# Patient Record
Sex: Female | Born: 1980 | Race: Black or African American | Hispanic: No | Marital: Single | Smoking: Current every day smoker
Health system: Southern US, Community
[De-identification: ages and names within clinical notes are randomized; demographics above are authoritative.]

## PROBLEM LIST (undated history)

## (undated) DIAGNOSIS — D649 Anemia, unspecified: Secondary | ICD-10-CM

## (undated) DIAGNOSIS — L089 Local infection of the skin and subcutaneous tissue, unspecified: Secondary | ICD-10-CM

## (undated) HISTORY — PX: DILATION AND CURETTAGE OF UTERUS: SHX78

---
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
## 1999-04-15 ENCOUNTER — Other Ambulatory Visit: Admission: RE | Admit: 1999-04-15 | Discharge: 1999-04-15 | Payer: Self-pay | Admitting: Obstetrics

## 1999-04-30 ENCOUNTER — Encounter (INDEPENDENT_AMBULATORY_CARE_PROVIDER_SITE_OTHER): Payer: Self-pay | Admitting: Specialist

## 1999-04-30 ENCOUNTER — Ambulatory Visit (HOSPITAL_COMMUNITY): Admission: RE | Admit: 1999-04-30 | Discharge: 1999-04-30 | Payer: Self-pay | Admitting: Obstetrics

## 1999-09-19 ENCOUNTER — Encounter: Payer: Self-pay | Admitting: Obstetrics

## 1999-09-19 ENCOUNTER — Inpatient Hospital Stay (HOSPITAL_COMMUNITY): Admission: AD | Admit: 1999-09-19 | Discharge: 1999-09-19 | Payer: Self-pay | Admitting: *Deleted

## 2000-01-28 ENCOUNTER — Encounter: Payer: Self-pay | Admitting: Obstetrics

## 2000-01-28 ENCOUNTER — Inpatient Hospital Stay (HOSPITAL_COMMUNITY): Admission: AD | Admit: 2000-01-28 | Discharge: 2000-01-28 | Payer: Self-pay | Admitting: Obstetrics

## 2002-05-25 ENCOUNTER — Ambulatory Visit (HOSPITAL_COMMUNITY): Admission: RE | Admit: 2002-05-25 | Discharge: 2002-05-25 | Payer: Self-pay | Admitting: *Deleted

## 2002-06-01 ENCOUNTER — Encounter: Admission: RE | Admit: 2002-06-01 | Discharge: 2002-06-01 | Payer: Self-pay | Admitting: *Deleted

## 2002-06-15 ENCOUNTER — Encounter: Admission: RE | Admit: 2002-06-15 | Discharge: 2002-06-15 | Payer: Self-pay | Admitting: *Deleted

## 2002-06-29 ENCOUNTER — Encounter: Admission: RE | Admit: 2002-06-29 | Discharge: 2002-06-29 | Payer: Self-pay | Admitting: *Deleted

## 2002-07-13 ENCOUNTER — Ambulatory Visit (HOSPITAL_COMMUNITY): Admission: RE | Admit: 2002-07-13 | Discharge: 2002-07-13 | Payer: Self-pay | Admitting: *Deleted

## 2002-07-13 ENCOUNTER — Encounter: Admission: RE | Admit: 2002-07-13 | Discharge: 2002-07-13 | Payer: Self-pay | Admitting: *Deleted

## 2002-07-27 ENCOUNTER — Encounter: Admission: RE | Admit: 2002-07-27 | Discharge: 2002-07-27 | Payer: Self-pay | Admitting: *Deleted

## 2002-08-10 ENCOUNTER — Encounter: Admission: RE | Admit: 2002-08-10 | Discharge: 2002-08-10 | Payer: Self-pay | Admitting: *Deleted

## 2002-08-10 ENCOUNTER — Inpatient Hospital Stay (HOSPITAL_COMMUNITY): Admission: AD | Admit: 2002-08-10 | Discharge: 2002-08-12 | Payer: Self-pay | Admitting: *Deleted

## 2002-08-17 ENCOUNTER — Encounter: Admission: RE | Admit: 2002-08-17 | Discharge: 2002-08-17 | Payer: Self-pay | Admitting: *Deleted

## 2002-08-22 ENCOUNTER — Ambulatory Visit (HOSPITAL_COMMUNITY): Admission: RE | Admit: 2002-08-22 | Discharge: 2002-08-22 | Payer: Self-pay | Admitting: *Deleted

## 2002-08-31 ENCOUNTER — Encounter: Admission: RE | Admit: 2002-08-31 | Discharge: 2002-08-31 | Payer: Self-pay | Admitting: *Deleted

## 2002-09-14 ENCOUNTER — Encounter: Admission: RE | Admit: 2002-09-14 | Discharge: 2002-09-14 | Payer: Self-pay | Admitting: *Deleted

## 2002-09-28 ENCOUNTER — Encounter: Admission: RE | Admit: 2002-09-28 | Discharge: 2002-09-28 | Payer: Self-pay | Admitting: *Deleted

## 2002-09-28 ENCOUNTER — Ambulatory Visit (HOSPITAL_COMMUNITY): Admission: RE | Admit: 2002-09-28 | Discharge: 2002-09-28 | Payer: Self-pay | Admitting: *Deleted

## 2002-10-12 ENCOUNTER — Encounter: Admission: RE | Admit: 2002-10-12 | Discharge: 2002-10-12 | Payer: Self-pay | Admitting: *Deleted

## 2002-10-19 ENCOUNTER — Encounter: Admission: RE | Admit: 2002-10-19 | Discharge: 2002-10-19 | Payer: Self-pay | Admitting: *Deleted

## 2002-11-02 ENCOUNTER — Encounter: Admission: RE | Admit: 2002-11-02 | Discharge: 2002-11-02 | Payer: Self-pay | Admitting: *Deleted

## 2002-11-09 ENCOUNTER — Encounter: Admission: RE | Admit: 2002-11-09 | Discharge: 2002-11-09 | Payer: Self-pay | Admitting: *Deleted

## 2002-11-16 ENCOUNTER — Encounter: Admission: RE | Admit: 2002-11-16 | Discharge: 2002-11-16 | Payer: Self-pay | Admitting: *Deleted

## 2002-11-23 ENCOUNTER — Encounter: Admission: RE | Admit: 2002-11-23 | Discharge: 2002-11-23 | Payer: Self-pay | Admitting: *Deleted

## 2002-11-26 ENCOUNTER — Inpatient Hospital Stay (HOSPITAL_COMMUNITY): Admission: AD | Admit: 2002-11-26 | Discharge: 2002-11-26 | Payer: Self-pay | Admitting: Obstetrics and Gynecology

## 2002-11-30 ENCOUNTER — Encounter: Admission: RE | Admit: 2002-11-30 | Discharge: 2002-11-30 | Payer: Self-pay | Admitting: *Deleted

## 2002-12-06 ENCOUNTER — Inpatient Hospital Stay (HOSPITAL_COMMUNITY): Admission: AD | Admit: 2002-12-06 | Discharge: 2002-12-06 | Payer: Self-pay | Admitting: *Deleted

## 2002-12-08 ENCOUNTER — Inpatient Hospital Stay (HOSPITAL_COMMUNITY): Admission: AD | Admit: 2002-12-08 | Discharge: 2002-12-11 | Payer: Self-pay | Admitting: *Deleted

## 2002-12-08 ENCOUNTER — Encounter: Admission: RE | Admit: 2002-12-08 | Discharge: 2002-12-08 | Payer: Self-pay | Admitting: *Deleted

## 2004-01-29 ENCOUNTER — Emergency Department (HOSPITAL_COMMUNITY): Admission: EM | Admit: 2004-01-29 | Discharge: 2004-01-29 | Payer: Self-pay | Admitting: Emergency Medicine

## 2004-07-11 ENCOUNTER — Inpatient Hospital Stay (HOSPITAL_COMMUNITY): Admission: AD | Admit: 2004-07-11 | Discharge: 2004-07-11 | Payer: Self-pay | Admitting: Obstetrics

## 2004-07-18 ENCOUNTER — Encounter (HOSPITAL_COMMUNITY): Admission: AD | Admit: 2004-07-18 | Discharge: 2004-08-17 | Payer: Self-pay | Admitting: Obstetrics

## 2004-08-22 ENCOUNTER — Encounter (HOSPITAL_COMMUNITY): Admission: AD | Admit: 2004-08-22 | Discharge: 2004-09-21 | Payer: Self-pay | Admitting: Obstetrics

## 2004-12-06 ENCOUNTER — Inpatient Hospital Stay (HOSPITAL_COMMUNITY): Admission: AD | Admit: 2004-12-06 | Discharge: 2004-12-06 | Payer: Self-pay | Admitting: Obstetrics

## 2004-12-12 ENCOUNTER — Inpatient Hospital Stay (HOSPITAL_COMMUNITY): Admission: AD | Admit: 2004-12-12 | Discharge: 2004-12-14 | Payer: Self-pay | Admitting: Obstetrics

## 2006-02-02 ENCOUNTER — Emergency Department (HOSPITAL_COMMUNITY): Admission: EM | Admit: 2006-02-02 | Discharge: 2006-02-02 | Payer: Self-pay | Admitting: Family Medicine

## 2006-04-27 ENCOUNTER — Emergency Department (HOSPITAL_COMMUNITY): Admission: EM | Admit: 2006-04-27 | Discharge: 2006-04-27 | Payer: Self-pay | Admitting: Family Medicine

## 2006-09-30 ENCOUNTER — Inpatient Hospital Stay (HOSPITAL_COMMUNITY): Admission: AD | Admit: 2006-09-30 | Discharge: 2006-10-06 | Payer: Self-pay | Admitting: Obstetrics

## 2006-10-11 ENCOUNTER — Emergency Department (HOSPITAL_COMMUNITY): Admission: EM | Admit: 2006-10-11 | Discharge: 2006-10-11 | Payer: Self-pay | Admitting: Family Medicine

## 2006-11-27 ENCOUNTER — Inpatient Hospital Stay (HOSPITAL_COMMUNITY): Admission: AD | Admit: 2006-11-27 | Discharge: 2006-11-30 | Payer: Self-pay | Admitting: Obstetrics

## 2006-11-28 ENCOUNTER — Encounter (INDEPENDENT_AMBULATORY_CARE_PROVIDER_SITE_OTHER): Payer: Self-pay | Admitting: Obstetrics

## 2010-08-02 NOTE — Discharge Summary (Signed)
NAME:  Christine Ingram, Christine Ingram                       ACCOUNT NO.:  [id number]   MEDICAL RECORD NO.:  [id number]                   PATIENT TYPE:  INP   LOCATION:  9154                                 FACILITY:  [facility]   PHYSICIAN:  Conni Elliot, M.D.             DATE OF BIRTH:  Jan 02, 1981   DATE OF ADMISSION:  08/10/2002  DATE OF DISCHARGE:  08/12/2002                                 DISCHARGE SUMMARY   DISCHARGE DIAGNOSES:  37. A 30 year old G7 P1-1-4-1 at 77 and five-sevenths weeks with threatened     preterm labor.  2. History of preterm delivery at 24 weeks.  3. History of multiple early pregnancy losses.  4. Positive for group B strep.  5. Fetal fibronectin from Aug 11, 2002 negative.  6. Positive ANA with a titer of 1:40.   DISCHARGE MEDICATIONS:  1. Amoxicillin 500 mg one p.o. t.i.d.  2. Prenatal vitamins.  3. Baby aspirin daily.   DISCHARGE INSTRUCTIONS:  1. The patient was given routine preterm labor instructions.  2. Advised to maintain bedrest and to avoid sexual intercourse or anything     that would cause her to have an orgasm.  3. The patient is to follow up at the high risk clinic on Wednesday, August 17, 2002.   HISTORY OF PRESENT ILLNESS:  The patient is a 30 year old G7 P1-1-4-1 (3  SABs, 1 TAB) who presented at 94 and three-sevenths weeks with complaint of  contractions.  Her dating is based on an unsure LMP that is consistent with  a 12-week ultrasound, giving her an estimated due date of December 05, 2002.  The patient had been complaining of Braxton Hicks uterine  contractions for approximately one week.  These contractions had increased  after her high risk clinic visit on the day of admission.  At the time of  that clinic her cervix was noted to be posterior, long, and fingertip  external, closed internal.  She was given a prescription for penicillin.  At  that time she also was noted to have a negative urinalysis.   OBSTETRICAL HISTORY:  Significant  for three early SABs, one TAB, history of  a 24-week spontaneous vaginal delivery in which the 1 pound 5 ounce infant  did not survive.  The patient had a 37 week successful spontaneous vaginal  delivery of a 5 pound 12 ounce infant in 1997.  She did have preterm labor  and was GBS positive.  Since then she has had no successful pregnancies.   PRIOR MEDICAL HISTORY:  Noncontributory.  The patient was noticed to have a  positive ANA titer of 1:40.   PHYSICAL EXAMINATION:  VITAL SIGNS:  Admission vital signs were within  normal limits including a blood pressure of 112/62.  GENERAL:  Noncontributory.  PELVIC:  Speculum exam revealed grayish discharge and no lesions.  Digital  exam performed by Caren Griffins, C.N.M. revealed external 1,  internal closed,  soft, long, midline, without any lower segment development, high.  MONITOR:  Dopplers had a heart rate of 150s to 160s and tocometry revealed  contractions approximately three to five minutes frequency lasting 30-40  seconds and mild intensity.   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  She was initiated on IV fluids,  ibuprofen, and Unasyn for antibiotics.  On hospital day #2 a fetal  fibronectin was obtained and was negative.  GC and chlamydia are both  negative.  GBS is positive.   The patient was discharged on Aug 12, 2002.  She had not had any  contractions for greater than 24 hours being off ibuprofen.  Her cervix was  unchanged.  Repeat exam by Dr. Gavin Potters being mildly firm, fingertip external  os, closed, long, no development of the lower segment.   DISPOSITION:  She was discharged with a prescription for amoxicillin and to  follow up at the high risk clinic the following week.     Douglass Rivers, M.D.                      Conni Elliot, M.D.    CH/MEDQ  D:  08/12/2002  T:   08/13/2002  Job:  [id number]

## 2010-08-02 NOTE — Discharge Summary (Signed)
NAMEBRIE, EPPARD NO.:  [id number]   MEDICAL RECORD NO.:  [id number]          PATIENT TYPE:  INP   LOCATION:  9157                          FACILITY:  [facility]   PHYSICIAN:  Kathreen Cosier, M.D.DATE OF BIRTH:  Mar 30, 1980   DATE OF ADMISSION:  09/30/2006  DATE OF DISCHARGE:  10/06/2006                               DISCHARGE SUMMARY   HISTORY OF PRESENT ILLNESS:  The patient is a 30-year-old gravida 8-3-0-  3, EDC 10/108.  She had 1 prenatal visit. GBS unknown. On exam in my  office she had ruptured membranes. She was having occasional  contraction.   HOSPITAL COURSE:  She was admitted to the hospital. She received  betamethasone 12.5 IM and then 24 hours. She was also started on  Ampicillin and gentamicin and magnesium sulfate 4 gram load and 3 gram  per hour. Ultrasound 7/17 said EDC 10/15, 29-week breech. She continued  using 2-3 pads a day, but then she stopped leaking by 7/21. Repeat AFI  was normal. There was no sign of leakage and she was not feeling her  contractions so she was discharged home on Procardia 10 mg every 6  hours.   DISCHARGE DIAGNOSIS:  Status post high leak which subsequently stopped.           ______________________________  Kathreen Cosier, M.D.     BAM/MEDQ  D:  10/28/2006  T:  10/29/2006  Job:  [id number]

## 2010-08-02 NOTE — Op Note (Signed)
[facility]  Patient:    Christine Ingram, Christine Ingram                    MRN: [id number] Proc. Date: 04/30/99 Adm. Date:  [id number] Attending:  Venita Sheffield                           Operative Report  PREOPERATIVE DIAGNOSIS:       Intrauterine fetal demise at nine-weeks.  POSTOPERATIVE DIAGNOSIS:      Intrauterine fetal demise at nine-weeks.  PROCEDURE:                    Dilatation and evacuation.  SURGEON:                      Kathreen Cosier, M.D.  ANESTHESIA:                   MAC.  DESCRIPTION OF PROCEDURE:     Using MAC, the patient in the lithotomy position,  perineum and vagina prepped and draped.  Bladder emptied with a straight catheter. Vaginal examination revealed uterus to be 10 or 12-week size.  A weighted speculum attached to the vagina.  The cervix was injected at 3, 9, and 12 oclock with 1% Xylocaine total of 5 cc.  The cervix was grasped at 12 oclock with a tenaculum. The endometrial cavity is found to be 11 cm.  The cervix dilated to #31 Shawnie Pons, using a #10 suction, the uterine contents were aspirated.  A large amount of tissue was obtained.  This was done until the cavity was clean.  The patient tolerated the procedure and taken to the recovery room in good condition. DD:  04/30/99 TD:  04/30/99 Job: [id number]

## 2010-11-09 ENCOUNTER — Emergency Department (HOSPITAL_COMMUNITY)
Admission: EM | Admit: 2010-11-09 | Discharge: 2010-11-09 | Payer: Medicaid Other | Attending: Emergency Medicine | Admitting: Emergency Medicine

## 2010-11-09 DIAGNOSIS — S61409A Unspecified open wound of unspecified hand, initial encounter: Secondary | ICD-10-CM | POA: Insufficient documentation

## 2010-11-09 DIAGNOSIS — Y92009 Unspecified place in unspecified non-institutional (private) residence as the place of occurrence of the external cause: Secondary | ICD-10-CM | POA: Insufficient documentation

## 2010-11-09 DIAGNOSIS — F1010 Alcohol abuse, uncomplicated: Secondary | ICD-10-CM | POA: Insufficient documentation

## 2010-11-15 ENCOUNTER — Emergency Department (HOSPITAL_COMMUNITY)
Admission: EM | Admit: 2010-11-15 | Discharge: 2010-11-15 | Disposition: A | Discharge status: Home / Self Care | Payer: Medicaid Other | Attending: Emergency Medicine | Admitting: Emergency Medicine

## 2010-11-15 DIAGNOSIS — L089 Local infection of the skin and subcutaneous tissue, unspecified: Secondary | ICD-10-CM | POA: Insufficient documentation

## 2010-11-15 DIAGNOSIS — R609 Edema, unspecified: Secondary | ICD-10-CM | POA: Insufficient documentation

## 2010-11-15 DIAGNOSIS — M79609 Pain in unspecified limb: Secondary | ICD-10-CM | POA: Insufficient documentation

## 2010-12-27 LAB — CBC
MCHC: 34.2
MCHC: 34.3
Platelets: 126 — ABNORMAL LOW
Platelets: 157
RBC: 3.1 — ABNORMAL LOW
RDW: 13
WBC: 7.1

## 2010-12-30 LAB — CBC
HCT: 32.5 — ABNORMAL LOW
MCV: 89
Platelets: 160
RBC: 3.65 — ABNORMAL LOW
WBC: 9

## 2010-12-30 LAB — CREATININE, SERUM
Creatinine, Ser: 0.66
GFR calc Af Amer: >60
GFR calc non Af Amer: >60

## 2010-12-30 LAB — RPR: RPR Ser Ql: NONREACTIVE

## 2012-05-08 ENCOUNTER — Emergency Department (INDEPENDENT_AMBULATORY_CARE_PROVIDER_SITE_OTHER)
Admission: EM | Admit: 2012-05-08 | Discharge: 2012-05-08 | Disposition: A | Discharge status: Home / Self Care | Payer: Self-pay | Source: Home / Self Care | Attending: Emergency Medicine | Admitting: Emergency Medicine

## 2012-05-08 ENCOUNTER — Encounter (HOSPITAL_COMMUNITY): Payer: Self-pay | Admitting: Emergency Medicine

## 2012-05-08 ENCOUNTER — Other Ambulatory Visit (HOSPITAL_COMMUNITY)
Admission: RE | Admit: 2012-05-08 | Discharge: 2012-05-08 | Disposition: A | Discharge status: Home / Self Care | Payer: Self-pay | Source: Ambulatory Visit | Attending: Emergency Medicine | Admitting: Emergency Medicine

## 2012-05-08 DIAGNOSIS — R109 Unspecified abdominal pain: Secondary | ICD-10-CM

## 2012-05-08 DIAGNOSIS — N760 Acute vaginitis: Secondary | ICD-10-CM | POA: Insufficient documentation

## 2012-05-08 DIAGNOSIS — Z113 Encounter for screening for infections with a predominantly sexual mode of transmission: Secondary | ICD-10-CM | POA: Insufficient documentation

## 2012-05-08 LAB — POCT URINALYSIS DIP (DEVICE)
Glucose, UA: NEGATIVE mg/dL
Nitrite: NEGATIVE
Protein, ur: 30 mg/dL — AB
Specific Gravity, Urine: 1.015 (ref 1.005–1.030)
Urobilinogen, UA: 4 mg/dL — ABNORMAL HIGH (ref 0.0–1.0)

## 2012-05-08 NOTE — ED Notes (Signed)
Pt c/o bright red vaginal spotting that started Friday night. Pt states the she had a positive home pregnancy test 6 days prior.  Pt denies pelvic or abdominal pain.

## 2012-05-08 NOTE — ED Provider Notes (Addendum)
Chief Complaint  Patient presents with  . Possible Pregnancy    vaginal bright red spotting that started friday night.     History of Present Illness:   Christine Ingram is a 32 year old female who's last menstrual period was January 17. She had some light spotting yesterday and today. She also had some abdominal pain lasting for a few seconds at a time radiating up towards the navel. She took 2 home pregnancy tests. One was negative and 1 was positive. She also had some nausea and vomiting. She denies any other pregnancy symptoms. The patient has been having unprotected intercourse. She has 5 children at home and has had a miscarriage and another baby that died as a premature infant. She denies any fever, chills, vaginal discharge, itching, or urinary symptoms.  Review of Systems:  Other than noted above, the patient denies any of the following symptoms: Systemic:  No fever, chills, sweats, fatigue, or weight loss. GI:  No abdominal pain, nausea, anorexia, vomiting, diarrhea, constipation, melena or hematochezia. GU:  No dysuria, frequency, urgency, hematuria, vaginal discharge, itching, or abnormal vaginal bleeding. Skin:  No rash or itching.   PMFSH:  Past medical history, family history, social history, meds, and allergies were reviewed.  Physical Exam:   Vital signs:  LMP 04/02/2012 General:  Alert, oriented and in no distress. Lungs:  Breath sounds clear and equal bilaterally.  No wheezes, rales or rhonchi. Heart:  Regular rhythm.  No gallops or murmers. Abdomen:  Soft, flat and non-distended.  No organomegaly or mass.  No tenderness, guarding or rebound.  Bowel sounds normally active. Pelvic exam:  Normal external genitalia, vaginal and cervical mucosa were normal, no discharge or bleeding, no pain on cervical motion. Uterus was normal in size and shape and nontender. No adnexal tenderness or mass. Skin:  Clear, warm and dry.  Labs:   Results for orders placed during the hospital  encounter of 05/08/12  POCT URINALYSIS DIP (DEVICE)      Result Value Range   Glucose, UA NEGATIVE  NEGATIVE mg/dL   Bilirubin Urine NEGATIVE  NEGATIVE   Ketones, ur NEGATIVE  NEGATIVE mg/dL   Specific Gravity, Urine 1.015  1.005 - 1.030   Hgb urine dipstick LARGE (*) NEGATIVE   pH 8.0  5.0 - 8.0   Protein, ur 30 (*) NEGATIVE mg/dL   Urobilinogen, UA 4.0 (*) 0.0 - 1.0 mg/dL   Nitrite NEGATIVE  NEGATIVE   Leukocytes, UA NEGATIVE  NEGATIVE  POCT PREGNANCY, URINE      Result Value Range   Preg Test, Ur NEGATIVE  NEGATIVE     Assessment:  The encounter diagnosis was Normal menstrual period.  She does not appear to be pregnant right now and this does not appear to represent a miscarriage or a tubal ectopic pregnancy.  Plan:   1.  The following meds were prescribed:  There are no discharge medications for this patient.  2.  The patient was instructed in symptomatic care and handouts were given. Also discussed birth control options with the patient and she was given a few phone numbers to call for getting on birth control. 3.  The patient was told to return if becoming worse in any way, if no better in 3 or 4 days, and given some red flag symptoms such as heavy bleeding, increase in pain, fever, nausea, or vomiting that would indicate earlier return.    Reuben Likes, MD 05/08/12 1954  Reuben Likes, MD 05/08/12 [phone]

## 2012-05-11 ENCOUNTER — Telehealth (HOSPITAL_COMMUNITY): Payer: Self-pay | Admitting: Emergency Medicine

## 2012-05-11 MED ORDER — METRONIDAZOLE 500 MG PO TABS: 500.0000 mg | ORAL_TABLET | Freq: Two times a day (BID) | ORAL | Status: DC

## 2012-05-11 MED ORDER — AZITHROMYCIN 500 MG PO TABS: 1000.0000 mg | ORAL_TABLET | Freq: Every day | ORAL | Status: DC

## 2012-05-11 NOTE — ED Notes (Signed)
DNA probes came back positive for gonorrhea, Chlamydia, and Gardnerella. She'll need to come in for Rocephin 250 mg IM, and I will send in prescriptions to her pharmacy for Flagyl 500 mg twice a day for a week and azithromycin 1000 mg as a single oral dose.  Reuben Likes, MD 05/11/12 2116

## 2012-05-11 NOTE — Telephone Encounter (Signed)
Message copied by Reuben Likes on Tue May 11, 2012  8:54 PM ------      Message from: Vassie Moselle      Created: Tue May 11, 2012  6:08 PM      Regarding: labs                   ----- Message -----         From: Lab In [facility] Interface         Sent: 05/10/2012   3:26 PM           To: [facility] Follow Up             ------

## 2012-05-11 NOTE — Telephone Encounter (Signed)
Message copied by Reuben Likes on Tue May 11, 2012  9:15 PM ------      Message from: Vassie Moselle      Created: Tue May 11, 2012  5:58 PM      Regarding: labs       GC/Chlamydia and Gardnerella pos. Needs tx.      Vassie Moselle      05/11/2012            ----- Message -----         From: [facility] Interface         Sent: 05/10/2012   3:26 PM           To: Vassie Moselle, RN                   ------

## 2012-05-11 NOTE — ED Notes (Signed)
DNA probes were positive for Chlamydia and Gardnerella. She will need Flagyl 500 mg twice a day for one week and azithromycin 1000 mg by mouth one time.  Reuben Likes, MD 05/11/12 [phone]

## 2012-05-12 ENCOUNTER — Encounter (HOSPITAL_COMMUNITY): Payer: Self-pay

## 2012-05-12 ENCOUNTER — Inpatient Hospital Stay (HOSPITAL_COMMUNITY)
Admission: AD | Admit: 2012-05-12 | Discharge: 2012-05-12 | Disposition: A | Discharge status: Home / Self Care | Payer: Self-pay | Source: Ambulatory Visit | Attending: Obstetrics & Gynecology | Admitting: Obstetrics & Gynecology

## 2012-05-12 DIAGNOSIS — R109 Unspecified abdominal pain: Secondary | ICD-10-CM | POA: Insufficient documentation

## 2012-05-12 DIAGNOSIS — A5619 Other chlamydial genitourinary infection: Secondary | ICD-10-CM | POA: Insufficient documentation

## 2012-05-12 DIAGNOSIS — N739 Female pelvic inflammatory disease, unspecified: Secondary | ICD-10-CM | POA: Insufficient documentation

## 2012-05-12 DIAGNOSIS — A5400 Gonococcal infection of lower genitourinary tract, unspecified: Secondary | ICD-10-CM | POA: Insufficient documentation

## 2012-05-12 HISTORY — DX: Anemia, unspecified: D64.9

## 2012-05-12 LAB — URINALYSIS, ROUTINE W REFLEX MICROSCOPIC
Bilirubin Urine: NEGATIVE
Glucose, UA: NEGATIVE mg/dL
Hgb urine dipstick: NEGATIVE
Ketones, ur: NEGATIVE mg/dL
Leukocytes, UA: NEGATIVE
Protein, ur: NEGATIVE mg/dL
pH: 8 (ref 5.0–8.0)

## 2012-05-12 MED ORDER — OXYCODONE-ACETAMINOPHEN 5-325 MG PO TABS: 1.0000 | ORAL_TABLET | ORAL | Status: DC | PRN

## 2012-05-12 MED ORDER — OXYCODONE-ACETAMINOPHEN 5-325 MG PO TABS
2.0000 | ORAL_TABLET | Freq: Once | ORAL | Status: AC
  Administered 2012-05-12: 2 via ORAL
  Filled 2012-05-12: qty 2

## 2012-05-12 MED ORDER — METRONIDAZOLE 500 MG PO TABS: 500.0000 mg | ORAL_TABLET | Freq: Two times a day (BID) | ORAL | Status: DC

## 2012-05-12 MED ORDER — DOXYCYCLINE HYCLATE 50 MG PO CAPS: 100.0000 mg | ORAL_CAPSULE | Freq: Two times a day (BID) | ORAL | Status: DC

## 2012-05-12 MED ORDER — CEFTRIAXONE SODIUM 250 MG IJ SOLR
250.0000 mg | Freq: Once | INTRAMUSCULAR | Status: AC
  Administered 2012-05-12: 250 mg via INTRAMUSCULAR
  Filled 2012-05-12: qty 250

## 2012-05-12 NOTE — MAU Provider Note (Signed)
  History     CSN: [id number]  Arrival date and time: 05/12/12 1756   First Provider Initiated Contact with Patient 05/12/12 1919      Chief Complaint  Patient presents with  . Abdominal Pain   HPI This is a 32 y.o. female who presents with C/O intermittent shooting pains in lower pelvis. Denies discharge or bleeding. Denies fever or back pain.   Was seen in Urgent Care and found to have Gonorrhea, Chlamydia, and BV.  Was prescribed medicines but did not get them filled. Doesn't know why.  RN Note: Pt states went to [facility] on 05/08/2012, had ?positive upt at home, went to [facility] and had negative upt there. Last pm began having shooting pains in lower pelvic area. LMP-05/06/2012, denies abnormal vaginal discharge. Pain worsens with movement.       OB History   Grav Para Term Preterm Abortions TAB SAB Ect Mult Living   8 6 5 1 2 1 1   5       Past Medical History  Diagnosis Date  . Preterm labor   . Anemia     Past Surgical History  Procedure Laterality Date  . Dilation and curettage of uterus      History reviewed. No pertinent family history.  History  Substance Use Topics  . Smoking status: Current Every Day Smoker -- 0.25 packs/day    Types: Cigarettes  . Smokeless tobacco: Never Used  . Alcohol Use: No    Allergies: No Known Allergies  Prescriptions prior to admission  Medication Sig Dispense Refill  . acetaminophen (TYLENOL) 500 MG tablet Take 1,000 mg by mouth as needed for pain (for pain).        Review of Systems  Constitutional: Negative for fever, chills and malaise/fatigue.  Gastrointestinal: Positive for abdominal pain. Negative for nausea, vomiting, diarrhea and constipation.  Genitourinary: Negative for dysuria.  Musculoskeletal: Negative for myalgias.  Neurological: Negative for dizziness and headaches.   Physical Exam   Blood pressure 146/90, pulse 109, temperature 99 F (37.2 C), temperature source Oral, resp. rate 20, height 5\' 2"  (1.575 m),  weight 134 lb 2 oz (60.839 kg), last menstrual period 05/06/2012, unknown if currently breastfeeding.  Physical Exam  Constitutional: She is oriented to person, place, and time. She appears well-developed and well-nourished. No distress.  HENT:  Head: Normocephalic.  Cardiovascular: Normal rate.   Respiratory: Effort normal.  GI: Soft. She exhibits no distension and no mass. There is tenderness. There is no rebound and no guarding.  Genitourinary: Uterus normal. Vaginal discharge (white discharge) found.  No cervical motion tenderness Generalized tenderness throughout bilaterally No rebound  Musculoskeletal: Normal range of motion.  Neurological: She is alert and oriented to person, place, and time.  Skin: Skin is warm and dry.  Psychiatric: She has a normal mood and affect.     MAU Course  Procedures  MDM UA negative  GC/Ch/Garderella positive from Urgent Care  Assessment and Plan  A:  PID  P:  Will treat with Rocephin here       Rx Doxy x14 days and Flagyl x 7 d      Advised to go to [facility] for serum testing for HIV, Hepatitis, and syphilis      Patient agrees to take meds and to go get testing.   [facility] 05/12/2012, 7:35 PM

## 2012-05-12 NOTE — MAU Note (Signed)
Pt states went to [facility] on 05/08/2012, had ?positive upt at home, went to [facility] and had negative upt there. Last pm began having shooting pains in lower pelvic area. LMP-05/06/2012, denies abnormal vaginal discharge. Pain worsens with movement.

## 2012-05-13 ENCOUNTER — Telehealth (HOSPITAL_COMMUNITY): Payer: Self-pay | Admitting: *Deleted

## 2012-05-13 ENCOUNTER — Encounter: Payer: Self-pay | Admitting: Family

## 2012-05-13 NOTE — ED Notes (Signed)
I called pt.  Pt. verified x 2 and given results.  Pt. said she started having bad pain yesterday and went to [facility]. She said got treated there.  I verified that she got on her chart that she got Rocephin 250 mg. IM and Rx. of Doxycycline and Flagyl e-prescribed to her pharmacy.  Pt. said she is going to pick up the medicine now.  Pt. instructed to no alcohol while taking Flagyl.  Pt. instructed to notify her partner, no sex for 1 week and to practice safe sex. Pt. told to get HIV retested in 6 mos.  She can get HIV testing at the [facility]. STD clinic, by appointment. DHHS form completed and faxed to the [facility] Department.  Dr. Melanee Spry orders from 2/25 discontinued. I will notify him of change in plan and treatment. Vassie Moselle 05/13/2012

## 2012-06-09 ENCOUNTER — Encounter (HOSPITAL_COMMUNITY): Payer: Self-pay

## 2012-06-09 ENCOUNTER — Emergency Department (HOSPITAL_COMMUNITY)
Admission: EM | Admit: 2012-06-09 | Discharge: 2012-06-09 | Disposition: A | Discharge status: Home / Self Care | Payer: Self-pay | Source: Home / Self Care

## 2012-06-09 DIAGNOSIS — L309 Dermatitis, unspecified: Secondary | ICD-10-CM

## 2012-06-09 DIAGNOSIS — L259 Unspecified contact dermatitis, unspecified cause: Secondary | ICD-10-CM

## 2012-06-09 MED ORDER — TRIAMCINOLONE ACETONIDE 40 MG/ML IJ SUSP
40.0000 mg | Freq: Once | INTRAMUSCULAR | Status: AC
  Administered 2012-06-09: 40 mg via INTRAMUSCULAR

## 2012-06-09 MED ORDER — TRIAMCINOLONE ACETONIDE 40 MG/ML IJ SUSP
INTRAMUSCULAR | Status: AC
  Filled 2012-06-09: qty 5

## 2012-06-09 MED ORDER — TRIAMCINOLONE ACETONIDE 0.1 % EX CREA: TOPICAL_CREAM | Freq: Two times a day (BID) | CUTANEOUS | Status: DC

## 2012-06-09 MED ORDER — METHYLPREDNISOLONE 4 MG PO KIT: PACK | ORAL | Status: DC

## 2012-06-09 NOTE — ED Notes (Signed)
Noted a rash on both lower legs, abd , back ; on doxycycline for STD  (500 BID ) ; no new foods, soaps, lotions

## 2012-06-09 NOTE — ED Provider Notes (Signed)
Medical screening examination/treatment/procedure(s) were performed by resident physician or non-physician practitioner and as supervising physician I was immediately available for consultation/collaboration.   Eulah Walkup DOUGLAS MD.   Sayuri Rhames D Ranson Belluomini, MD 06/09/12 2052 

## 2012-06-09 NOTE — ED Provider Notes (Signed)
History     CSN: [id number]  Arrival date & time 06/09/12  1632   First MD Initiated Contact with Patient 06/09/12 1706      Chief Complaint  Patient presents with  . Rash    (Consider location/radiation/quality/duration/timing/severity/associated sxs/prior treatment) HPI Comments: 32 year old female presents with a complaint of rash. He began as small red papules lower extremities but over the past 2 days has increased to most body surface areas. She denies 6 symptoms. No fever, chills, malaise, upper respiratory congestion, or other symptoms.  states feels generally well. no one else in the household has a similar rash. 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 Patient states history little itching associated with this rash.   Past Medical History  Diagnosis Date  . Preterm labor   . Anemia     Past Surgical History  Procedure Laterality Date  . Dilation and curettage of uterus      History reviewed. No pertinent family history.  History  Substance Use Topics  . Smoking status: Current Every Day Smoker -- 0.25 packs/day    Types: Cigarettes  . Smokeless tobacco: Never Used  . Alcohol Use: No    OB History   Grav Para Term Preterm Abortions TAB SAB Ect Mult Living   8 6 5 1 2 1 1   5       Review of Systems  Constitutional: Negative.   HENT: Negative.   Respiratory: Negative.   Gastrointestinal: Negative.   Genitourinary: Negative.   Skin:       As per history of present illness    Allergies  Review of patient's allergies indicates no known allergies.  Home Medications   Current Outpatient Rx  Name  Route  Sig  Dispense  Refill  . acetaminophen (TYLENOL) 500 MG tablet   Oral   Take 1,000 mg by mouth as needed for pain (for pain).         Marland Kitchen doxycycline (VIBRAMYCIN) 50 MG capsule   Oral   Take 2 capsules (100 mg total) by mouth 2 (two) times daily.   20 capsule   0   . methylPREDNISolone (MEDROL DOSEPAK) 4 MG tablet     follow package directions   21 tablet   0   . metroNIDAZOLE (FLAGYL) 500 MG tablet   Oral   Take 1 tablet (500 mg total) by mouth 2 (two) times daily.   14 tablet   0   . oxyCODONE-acetaminophen (ROXICET) 5-325 MG per tablet   Oral   Take 1 tablet by mouth every 4 (four) hours as needed for pain.   10 tablet   0   . triamcinolone cream (KENALOG) 0.1 %   Topical   Apply topically 2 (two) times daily. Apply for 2 weeks. May use on face   30 g   0     BP 107/79  Pulse 84  Temp(Src) 98.1 F (36.7 C) (Oral)  Resp 16  SpO2 100%  LMP 05/06/2012  Physical Exam  Nursing note and vitals reviewed. Constitutional: She is oriented to person, place, and time. She appears well-developed and well-nourished. No distress.  Eyes: Conjunctivae and EOM are normal.  Neck: Neck supple.  Cardiovascular: Normal rate and normal heart sounds.   Pulmonary/Chest: Effort normal and breath sounds normal.  Musculoskeletal: Normal range of motion.  Patient states she feels a little tight numbness in the left lower extremity as if there was mild swelling. Both lower extremities appear the same size  to this examiner.  Neurological: She is alert and oriented to person, place, and time.  Skin: Skin is warm and dry. Rash noted.  Macular papular rash with him sleep ability papules over a red base on both lower extremities. There are several satellite papules. There also isolated papules less than populated on the torso and extremities.  Psychiatric: She has a normal mood and affect.    ED Course  Procedures (including critical care time)  Labs Reviewed - No data to display No results found.   1. Dermatitis       MDM  Patient is a dermatitis of uncertain etiology. We will treat with steroids. Kenalog 40 mg IM Medrol Dosepak And triamcinolone cream. If not improving 2-3 days call the dermatologist for an appointment. Return for any symptoms problems or worsening. Followup with a dermatologist  as written on your instructions. If not improving in a couple days or developing new symptoms or worsening call center for appointment or may return.  Hayden Rasmussen, NP 06/09/12 1928

## 2012-06-22 ENCOUNTER — Encounter (HOSPITAL_COMMUNITY): Payer: Self-pay | Admitting: *Deleted

## 2012-06-22 DIAGNOSIS — L738 Other specified follicular disorders: Secondary | ICD-10-CM | POA: Insufficient documentation

## 2012-06-22 DIAGNOSIS — Z862 Personal history of diseases of the blood and blood-forming organs and certain disorders involving the immune mechanism: Secondary | ICD-10-CM | POA: Insufficient documentation

## 2012-06-22 DIAGNOSIS — Z8751 Personal history of pre-term labor: Secondary | ICD-10-CM | POA: Insufficient documentation

## 2012-06-22 DIAGNOSIS — L02419 Cutaneous abscess of limb, unspecified: Secondary | ICD-10-CM | POA: Insufficient documentation

## 2012-06-22 DIAGNOSIS — F17200 Nicotine dependence, unspecified, uncomplicated: Secondary | ICD-10-CM | POA: Insufficient documentation

## 2012-06-22 NOTE — ED Notes (Signed)
The pt  Has had a rash on her lt lower leg for 1-2 weeks.  She [facility] the [facility] but the lesions have become worse.  She has more pain in the lt lower leg and the lesions appear more like ulcers on her leg also the rt leg has some and the rash has spread to other  Parts of her body

## 2012-06-23 ENCOUNTER — Emergency Department (HOSPITAL_COMMUNITY)
Admission: EM | Admit: 2012-06-23 | Discharge: 2012-06-23 | Disposition: A | Discharge status: Home / Self Care | Payer: Self-pay | Attending: Emergency Medicine | Admitting: Emergency Medicine

## 2012-06-23 DIAGNOSIS — L739 Follicular disorder, unspecified: Secondary | ICD-10-CM

## 2012-06-23 DIAGNOSIS — L0291 Cutaneous abscess, unspecified: Secondary | ICD-10-CM

## 2012-06-23 LAB — CBC WITH DIFFERENTIAL/PLATELET
Lymphocytes Relative: 25 % (ref 12–46)
Lymphs Abs: 1.8 10*3/uL (ref 0.7–4.0)
Neutro Abs: 4.7 10*3/uL (ref 1.7–7.7)
Neutrophils Relative %: 64 % (ref 43–77)
Platelets: 205 10*3/uL (ref 150–400)
RBC: 3.83 MIL/uL — ABNORMAL LOW (ref 3.87–5.11)
WBC: 7.3 10*3/uL (ref 4.0–10.5)

## 2012-06-23 LAB — COMPREHENSIVE METABOLIC PANEL
ALT: 15 U/L (ref 0–35)
Alkaline Phosphatase: 49 U/L (ref 39–117)
CO2: 27 mEq/L (ref 19–32)
GFR calc Af Amer: >90 mL/min (ref >90)
GFR calc non Af Amer: >90 mL/min (ref >90)
Glucose, Bld: 100 mg/dL — ABNORMAL HIGH (ref 70–99)
Potassium: 4 mEq/L (ref 3.5–5.1)
Sodium: 139 mEq/L (ref 135–145)

## 2012-06-23 MED ORDER — CLINDAMYCIN HCL 150 MG PO CAPS: 300.0000 mg | ORAL_CAPSULE | Freq: Three times a day (TID) | ORAL | Status: DC

## 2012-06-23 MED ORDER — MORPHINE SULFATE 4 MG/ML IJ SOLN
4.0000 mg | Freq: Once | INTRAMUSCULAR | Status: AC
  Administered 2012-06-23: 4 mg via INTRAVENOUS
  Filled 2012-06-23: qty 1

## 2012-06-23 MED ORDER — OXYCODONE-ACETAMINOPHEN 5-325 MG PO TABS: 1.0000 | ORAL_TABLET | Freq: Four times a day (QID) | ORAL | Status: DC | PRN

## 2012-06-23 MED ORDER — ONDANSETRON HCL 4 MG/2ML IJ SOLN
INTRAMUSCULAR | Status: AC
  Administered 2012-06-23: 07:00:00
  Filled 2012-06-23: qty 2

## 2012-06-23 MED ORDER — HYDROCODONE-ACETAMINOPHEN 5-325 MG PO TABS
1.0000 | ORAL_TABLET | Freq: Once | ORAL | Status: AC
  Administered 2012-06-23: 1 via ORAL
  Filled 2012-06-23: qty 1

## 2012-06-23 MED ORDER — CLINDAMYCIN PHOSPHATE 900 MG/50ML IV SOLN
900.0000 mg | Freq: Once | INTRAVENOUS | Status: AC
  Administered 2012-06-23: 900 mg via INTRAVENOUS
  Filled 2012-06-23: qty 50

## 2012-06-23 NOTE — ED Provider Notes (Signed)
History     CSN: [id number]  Arrival date & time 06/22/12  2227   First MD Initiated Contact with Patient 06/23/12 [phone]      Chief Complaint  Patient presents with  . Rash   HPI  History provided by the patient and recent medical chart. Patient is a 32 year old female with past history of anemia presenting with worsening rash. Patient reports first having a rash to her lower extremities one to 2 weeks ago. She [facility] and diagnosed with an allergic dermatitis. She was given a dose of steroids at the urgent care Center as well as prescriptions for medrol dose pack and topical triamcinolone cream. Patient has been using this the reports having worsening of the rash now with blisters and ulcers. She reports having some drainage from these blisters to the lower legs. There is also some spots and rash to her upper extremities and chest now. Denies any associated fever, chills or sweats. No nausea vomiting. No headache or neck stiffness. Denies any similar symptoms previously. No known allergies. No change in soaps or lotions. No other new medications besides what was prescribed at the urgent care.    Past Medical History  Diagnosis Date  . Preterm labor   . Anemia     Past Surgical History  Procedure Laterality Date  . Dilation and curettage of uterus      No family history on file.  History  Substance Use Topics  . Smoking status: Current Every Day Smoker -- 0.25 packs/day    Types: Cigarettes  . Smokeless tobacco: Never Used  . Alcohol Use: No    OB History   Grav Para Term Preterm Abortions TAB SAB Ect Mult Living   8 6 5 1 2 1 1   5       Review of Systems  Constitutional: Negative for fever, chills and diaphoresis.  Genitourinary: Negative for dysuria, frequency, hematuria, flank pain, vaginal bleeding, vaginal discharge and menstrual problem.  Skin: Positive for rash.  All other systems reviewed and are negative.    Allergies   Review of patient's allergies indicates no known allergies.  Home Medications   Current Outpatient Rx  Name  Route  Sig  Dispense  Refill  . ibuprofen (ADVIL,MOTRIN) 200 MG tablet   Oral   Take 600-800 mg by mouth every 6 (six) hours as needed for pain.         . methylPREDNISolone (MEDROL DOSEPAK) 4 MG tablet   Oral   Take 4-24 mg by mouth daily. Day 1 take 6 tablets, day 2 take 5 tablets, day 3 take 4 tablets, day 4 take 3 tablets, day 5 take 2 tablets, day 6 take 1 tablet         . oxyCODONE-acetaminophen (ROXICET) 5-325 MG per tablet   Oral   Take 1 tablet by mouth every 4 (four) hours as needed for pain.   10 tablet   0   . triamcinolone cream (KENALOG) 0.1 %   Topical   Apply topically 2 (two) times daily. Apply for 2 weeks. May use on face   30 g   0     BP 148/90  Pulse 92  Temp(Src) 98.4 F (36.9 C)  Resp 20  SpO2 97%  Physical Exam  Nursing note and vitals reviewed. Constitutional: She is oriented to person, place, and time. She appears well-developed and well-nourished. No distress.  HENT:  Head: Normocephalic.  Mouth/Throat: Oropharynx is clear  and moist.  No ulcerations or lesions or petechia of the mouth, tongue or lips.  Neck: Normal range of motion. Neck supple.  No meningeal signs  Cardiovascular: Normal rate and regular rhythm.   Pulmonary/Chest: Effort normal and breath sounds normal. No respiratory distress. She has no wheezes.  Abdominal: Soft.  Musculoskeletal: Normal range of motion.  Neurological: She is alert and oriented to person, place, and time.  Skin: Skin is warm and dry. Rash noted.  Grouped macular and petechial type rash to the lower extremities most extensively over the lower extremity at the ankle and foot. There is some hemorrhagic bulla appearing lesions to left lower extremity as well most prominent posteriorly overlying acuities tendon. There is evidence of a ruptured bulla to the medial left ankle with slight ulceration.   There are some sporadic macula papular lesions on the upper extremities and chest area as well. Rash does not extend to the palms of hands or soles of the feet.no involvement of the neck or face. Normal  mucus membranes  Psychiatric: She has a normal mood and affect. Her behavior is normal.    ED Course  Procedures   INCISION AND DRAINAGE Performed by: Angus Seller Consent: Verbal consent obtained. Risks and benefits: risks, benefits and alternatives were discussed Type: abscess  Body area: left lower leg  Anesthesia: local infiltration  Incision was made with a 18-gauge needle.  Local anesthetic: none  Complexity: simple  Drainage: purulent with blood  Drainage amount: small  Packing material:  none  Patient tolerance: Patient tolerated the procedure well with no immediate complications.        Results for orders placed during the hospital encounter of 06/23/12  CBC WITH DIFFERENTIAL      Result Value Range   WBC 7.3  4.0 - 10.5 K/uL   RBC 3.83 (*) 3.87 - 5.11 MIL/uL   Hemoglobin 11.3 (*) 12.0 - 15.0 g/dL   HCT 45.4 (*) 09.8 - 11.9 %   MCV 86.7  78.0 - 100.0 fL   MCH 29.5  26.0 - 34.0 pg   MCHC 34.0  30.0 - 36.0 g/dL   RDW 14.7  82.9 - 56.2 %   Platelets 205  150 - 400 K/uL   Neutrophils Relative 64  43 - 77 %   Neutro Abs 4.7  1.7 - 7.7 K/uL   Lymphocytes Relative 25  12 - 46 %   Lymphs Abs 1.8  0.7 - 4.0 K/uL   Monocytes Relative 9  3 - 12 %   Monocytes Absolute 0.6  0.1 - 1.0 K/uL   Eosinophils Relative 2  0 - 5 %   Eosinophils Absolute 0.1  0.0 - 0.7 K/uL   Basophils Relative 0  0 - 1 %   Basophils Absolute 0.0  0.0 - 0.1 K/uL  COMPREHENSIVE METABOLIC PANEL      Result Value Range   Sodium 139  135 - 145 mEq/L   Potassium 4.0  3.5 - 5.1 mEq/L   Chloride 104  96 - 112 mEq/L   CO2 27  19 - 32 mEq/L   Glucose, Bld 100 (*) 70 - 99 mg/dL   BUN 16  6 - 23 mg/dL   Creatinine, Ser 1.30  0.50 - 1.10 mg/dL   Calcium 8.5  8.4 - 86.5 mg/dL   Total  Protein 6.7  6.0 - 8.3 g/dL   Albumin 3.3 (*) 3.5 - 5.2 g/dL   AST 21  0 - 37 U/L  ALT 15  0 - 35 U/L   Alkaline Phosphatase 49  39 - 117 U/L   Total Bilirubin 0.3  0.3 - 1.2 mg/dL   GFR calc non Af Amer >90  >90 mL/min   GFR calc Af Amer >90  >90 mL/min  PROTIME-INR      Result Value Range   Prothrombin Time 13.4  11.6 - 15.2 seconds   INR 1.03  0.00 - 1.49  APTT      Result Value Range   aPTT 33  24 - 37 seconds       1. Folliculitis   2. Cellulitis and abscess       MDM  2:00AM patient seen and evaluated. Patient well-appearing in no acute distress. She is not appears fairly ill or toxic.  Patient was also seen and evaluated with attending physician. Condition looks concerning for bacterial cellulitis and folliculitis along the lower extremities. Several of the below were pierced with 18-gauge needle with purulent and sanguinous drainage. Will begin patient on clindamycin with IV dose in the emergency room. Patient instructed to discontinue her steroids and triamcinolone cream. She also advised to have a recheck in 2 days.      Angus Seller, PA-C 06/23/12 [phone]

## 2012-06-23 NOTE — ED Notes (Signed)
Pt was seen at [facility] last week for rash to bilateral lower legs.  Pt was given steroid injection and cream to apply at home.  Pt states the rash has gotten worse since then and has began spreading to other areas of body.  Pt states it is painful.  Redness noted around lesions to left lower leg.  IV in place.  Will continue to monitor pt.

## 2012-06-23 NOTE — ED Notes (Signed)
MD at bedside. 

## 2012-06-23 NOTE — ED Notes (Signed)
Pt updated on wait.  °

## 2012-06-24 ENCOUNTER — Encounter: Payer: Medicaid Other | Admitting: Family

## 2012-06-24 NOTE — ED Provider Notes (Signed)
Medical screening examination/treatment/procedure(s) were performed by non-physician practitioner and as supervising physician I was immediately available for consultation/collaboration.  John-Adam Ladislao Cohenour, M.D.     John-Adam Keyetta Hollingworth, MD 06/24/12 0507 

## 2012-08-19 ENCOUNTER — Encounter (HOSPITAL_COMMUNITY): Payer: Self-pay | Admitting: Emergency Medicine

## 2012-08-19 ENCOUNTER — Emergency Department (HOSPITAL_COMMUNITY): Payer: Self-pay

## 2012-08-19 ENCOUNTER — Emergency Department (HOSPITAL_COMMUNITY)
Admission: EM | Admit: 2012-08-19 | Discharge: 2012-08-19 | Disposition: A | Discharge status: Home / Self Care | Payer: Self-pay | Attending: Emergency Medicine | Admitting: Emergency Medicine

## 2012-08-19 DIAGNOSIS — S8263XA Displaced fracture of lateral malleolus of unspecified fibula, initial encounter for closed fracture: Secondary | ICD-10-CM | POA: Insufficient documentation

## 2012-08-19 DIAGNOSIS — M79609 Pain in unspecified limb: Secondary | ICD-10-CM

## 2012-08-19 DIAGNOSIS — F17200 Nicotine dependence, unspecified, uncomplicated: Secondary | ICD-10-CM | POA: Insufficient documentation

## 2012-08-19 DIAGNOSIS — Y998 Other external cause status: Secondary | ICD-10-CM | POA: Insufficient documentation

## 2012-08-19 DIAGNOSIS — X500XXA Overexertion from strenuous movement or load, initial encounter: Secondary | ICD-10-CM | POA: Insufficient documentation

## 2012-08-19 DIAGNOSIS — D649 Anemia, unspecified: Secondary | ICD-10-CM | POA: Insufficient documentation

## 2012-08-19 DIAGNOSIS — L02619 Cutaneous abscess of unspecified foot: Secondary | ICD-10-CM | POA: Insufficient documentation

## 2012-08-19 DIAGNOSIS — Y929 Unspecified place or not applicable: Secondary | ICD-10-CM | POA: Insufficient documentation

## 2012-08-19 DIAGNOSIS — S82839A Other fracture of upper and lower end of unspecified fibula, initial encounter for closed fracture: Secondary | ICD-10-CM

## 2012-08-19 DIAGNOSIS — L0390 Cellulitis, unspecified: Secondary | ICD-10-CM

## 2012-08-19 DIAGNOSIS — R21 Rash and other nonspecific skin eruption: Secondary | ICD-10-CM | POA: Insufficient documentation

## 2012-08-19 DIAGNOSIS — Z8742 Personal history of other diseases of the female genital tract: Secondary | ICD-10-CM | POA: Insufficient documentation

## 2012-08-19 HISTORY — DX: Local infection of the skin and subcutaneous tissue, unspecified: L08.9

## 2012-08-19 LAB — CBC
HCT: 35.6 % — ABNORMAL LOW (ref 36.0–46.0)
MCHC: 32 g/dL (ref 30.0–36.0)
MCV: 90.4 fL (ref 78.0–100.0)
RDW: 13.9 % (ref 11.5–15.5)
WBC: 6.6 10*3/uL (ref 4.0–10.5)

## 2012-08-19 LAB — BASIC METABOLIC PANEL
BUN: 9 mg/dL (ref 6–23)
Chloride: 106 mEq/L (ref 96–112)
Creatinine, Ser: 0.78 mg/dL (ref 0.50–1.10)
GFR calc Af Amer: >90 mL/min (ref >90)
Glucose, Bld: 115 mg/dL — ABNORMAL HIGH (ref 70–99)

## 2012-08-19 MED ORDER — SODIUM CHLORIDE 0.9 % IV SOLN
INTRAVENOUS | Status: DC
  Administered 2012-08-19: 20 mL/h via INTRAVENOUS

## 2012-08-19 MED ORDER — CEPHALEXIN 500 MG PO CAPS: 500.0000 mg | ORAL_CAPSULE | Freq: Four times a day (QID) | ORAL | Status: DC

## 2012-08-19 MED ORDER — MORPHINE SULFATE 4 MG/ML IJ SOLN
4.0000 mg | Freq: Once | INTRAMUSCULAR | Status: AC
  Administered 2012-08-19: 4 mg via INTRAVENOUS
  Filled 2012-08-19: qty 1

## 2012-08-19 MED ORDER — HYDROCODONE-ACETAMINOPHEN 5-325 MG PO TABS: 1.0000 | ORAL_TABLET | Freq: Four times a day (QID) | ORAL | Status: DC | PRN

## 2012-08-19 MED ORDER — CEFAZOLIN SODIUM 1-5 GM-% IV SOLN
1.0000 g | Freq: Once | INTRAVENOUS | Status: AC
  Administered 2012-08-19: 1 g via INTRAVENOUS
  Filled 2012-08-19: qty 50

## 2012-08-19 MED ORDER — ONDANSETRON HCL 4 MG/2ML IJ SOLN
4.0000 mg | Freq: Once | INTRAMUSCULAR | Status: AC
  Administered 2012-08-19: 4 mg via INTRAVENOUS
  Filled 2012-08-19: qty 2

## 2012-08-19 NOTE — ED Provider Notes (Addendum)
History     CSN: [id number]  Arrival date & time 08/19/12  1039   First MD Initiated Contact with Patient 08/19/12 1113      Chief Complaint  Patient presents with  . Foot Pain    (Consider location/radiation/quality/duration/timing/severity/associated sxs/prior treatment) Patient is a 32 y.o. female presenting with lower extremity pain. The history is provided by the patient.  Foot Pain Pertinent negatives include no chest pain, no abdominal pain, no headaches and no shortness of breath.  pt c/o twisting injury to right ankle 3-4 days ago. Pain constant, dull, mod. States had been unable to walk on/bear wt initially, but now w limited ability to stand/walk on ankle due to pain. In past 1-2 days notes spreading redness, pain around foot and ankle. Did have skin rash/lesions to area previously - much improved, but did have open/cracked areas to skin. Notes hx cellulitis in other foot/lower leg recently. No hx dvt or pe. No hx diabetes. No sickle cell or other chronic illness. Does not feel sick/ill. No nv. No fever or chills.     Past Medical History  Diagnosis Date  . Preterm labor   . Anemia   . Skin infection     Past Surgical History  Procedure Laterality Date  . Dilation and curettage of uterus      No family history on file.  History  Substance Use Topics  . Smoking status: Current Every Day Smoker -- 0.25 packs/day    Types: Cigarettes  . Smokeless tobacco: Never Used  . Alcohol Use: No    OB History   Grav Para Term Preterm Abortions TAB SAB Ect Mult Living   8 6 5 1 2 1 1   5       Review of Systems  Constitutional: Negative for fever and chills.  HENT: Negative for neck pain.   Eyes: Negative for redness.  Respiratory: Negative for shortness of breath.   Cardiovascular: Negative for chest pain.  Gastrointestinal: Negative for abdominal pain.  Genitourinary: Negative for flank pain.  Musculoskeletal: Negative for back pain.  Skin: Negative for rash.   Neurological: Negative for headaches.  Hematological: Does not bruise/bleed easily.  Psychiatric/Behavioral: Negative for confusion.    Allergies  Review of patient's allergies indicates no known allergies.  Home Medications  No current outpatient prescriptions on file.  BP 117/68  Pulse 91  Temp(Src) 98 F (36.7 C) (Oral)  Resp 20  SpO2 100%  Physical Exam  Nursing note and vitals reviewed. Constitutional: She is oriented to person, place, and time. She appears well-developed and well-nourished. No distress.  HENT:  Mouth/Throat: Oropharynx is clear and moist.  Eyes: Conjunctivae are normal. No scleral icterus.  Neck: Neck supple. No tracheal deviation present.  Cardiovascular: Normal rate, regular rhythm, normal heart sounds and intact distal pulses.   Pulmonary/Chest: Effort normal and breath sounds normal. No respiratory distress.  Abdominal: Soft. Normal appearance and bowel sounds are normal. She exhibits no distension. There is no tenderness.  Musculoskeletal:  Right foot ankle increased warmth, tenderness, erythema c/w cellulitis. Dp/pt 2+. No crepitus. No necrotic tissue. No lymphangitis. No calf swelling or tenderness.   Neurological: She is alert and oriented to person, place, and time.  Skin: Skin is warm and dry. No rash noted.  Psychiatric: She has a normal mood and affect.    ED Course  Procedures (including critical care time)  Results for orders placed during the hospital encounter of 08/19/12  CBC      Result Value  Range   WBC 6.6  4.0 - 10.5 K/uL   RBC 3.94  3.87 - 5.11 MIL/uL   Hemoglobin 11.4 (*) 12.0 - 15.0 g/dL   HCT 11.9 (*) 14.7 - 82.9 %   MCV 90.4  78.0 - 100.0 fL   MCH 28.9  26.0 - 34.0 pg   MCHC 32.0  30.0 - 36.0 g/dL   RDW 56.2  13.0 - 86.5 %   Platelets 202  150 - 400 K/uL  BASIC METABOLIC PANEL      Result Value Range   Sodium 141  135 - 145 mEq/L   Potassium 3.9  3.5 - 5.1 mEq/L   Chloride 106  96 - 112 mEq/L   CO2 30  19 - 32  mEq/L   Glucose, Bld 115 (*) 70 - 99 mg/dL   BUN 9  6 - 23 mg/dL   Creatinine, Ser 7.84  0.50 - 1.10 mg/dL   Calcium 8.7  8.4 - 69.6 mg/dL   GFR calc non Af Amer >90  >90 mL/min   GFR calc Af Amer >90  >90 mL/min   Dg Ankle Complete Right  08/19/2012   *RADIOLOGY REPORT*  Clinical Data: Twisting injury, pain.  Swelling.  RIGHT ANKLE - COMPLETE 3+ VIEW  Comparison: None.  Findings: There is an oblique fracture through the distal right fibula.  No tibial fracture.  The fibular fracture is minimally- displaced.  Soft tissue swelling.  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  IMPRESSION: Oblique distal right fibular fracture.   Original Report Authenticated By: Charlett Nose, M.D.       MDM  Iv ns. Labs. Ancef iv.  Xr.  Reviewed nursing notes and prior charts for additional history.   Posterior splint right ankle. Crutches. Elevate/ice.  Pt says she has ride, does not have to drive. Morphine iv for pain.   Recheck pain controlled.   Distal pulses palp.  Pt stable for d/c.    VASCULAR LAB  PRELIMINARY PRELIMINARY PRELIMINARY PRELIMINARY  Right lower extremity venous Doppler completed.  Preliminary report: There is no DVT or SVT noted in the right lower extremity.  KANADY, CANDACE, RVT  08/19/2012, 2:04 PM        Suzi Roots, MD 08/19/12 [phone]

## 2012-08-19 NOTE — Progress Notes (Signed)
VASCULAR LAB PRELIMINARY  PRELIMINARY  PRELIMINARY  PRELIMINARY  Right lower extremity venous Doppler completed.    Preliminary report:  There is no DVT or SVT noted in the right lower extremity.  Olvin Rohr, RVT 08/19/2012, 2:04 PM

## 2012-08-19 NOTE — ED Notes (Signed)
Onset March 2014 skin infection bilateral lower extremity. Stated took antibiotics with improvement.  Onset 4 days ago possibly twisted right ankle and for the past 4 days having increased swelling and skin light pink red.

## 2012-08-19 NOTE — Progress Notes (Signed)
Orthopedic Tech Progress Note Patient Details:  Christine Ingram 02/06/1981 [id number]  Ortho Devices Type of Ortho Device: Crutches;Post (short) splint;Ace wrap Splint Material: Fiberglass Ortho Device/Splint Interventions: Application   Cammer, Mickie Bail 08/19/2012, 3:07 PM

## 2012-08-19 NOTE — ED Notes (Signed)
Paged and spoke with Ortho

## 2012-12-21 ENCOUNTER — Other Ambulatory Visit (HOSPITAL_COMMUNITY)
Admission: RE | Admit: 2012-12-21 | Discharge: 2012-12-21 | Disposition: A | Discharge status: Home / Self Care | Payer: Self-pay | Source: Ambulatory Visit | Attending: Family Medicine | Admitting: Family Medicine

## 2012-12-21 ENCOUNTER — Emergency Department (INDEPENDENT_AMBULATORY_CARE_PROVIDER_SITE_OTHER)
Admission: EM | Admit: 2012-12-21 | Discharge: 2012-12-21 | Disposition: A | Discharge status: Home / Self Care | Payer: Self-pay | Source: Home / Self Care | Attending: Family Medicine | Admitting: Family Medicine

## 2012-12-21 ENCOUNTER — Encounter (HOSPITAL_COMMUNITY): Payer: Self-pay | Admitting: Emergency Medicine

## 2012-12-21 DIAGNOSIS — N760 Acute vaginitis: Secondary | ICD-10-CM

## 2012-12-21 DIAGNOSIS — B9689 Other specified bacterial agents as the cause of diseases classified elsewhere: Secondary | ICD-10-CM

## 2012-12-21 DIAGNOSIS — Z113 Encounter for screening for infections with a predominantly sexual mode of transmission: Secondary | ICD-10-CM | POA: Insufficient documentation

## 2012-12-21 DIAGNOSIS — A499 Bacterial infection, unspecified: Secondary | ICD-10-CM

## 2012-12-21 LAB — POCT URINALYSIS DIP (DEVICE)
Hgb urine dipstick: NEGATIVE
Protein, ur: NEGATIVE mg/dL
Specific Gravity, Urine: >=1.03 (ref 1.005–1.030)
Urobilinogen, UA: 0.2 mg/dL (ref 0.0–1.0)
pH: 6 (ref 5.0–8.0)

## 2012-12-21 MED ORDER — METRONIDAZOLE 500 MG PO TABS: 500.0000 mg | ORAL_TABLET | Freq: Two times a day (BID) | ORAL | Status: DC

## 2012-12-21 NOTE — ED Notes (Signed)
C/o abd pain which started two days ago.  States stomach is tender to touch.  Denies nausea and vomiting but does have diarrhea.    Patient wants to get check for std since partner stepped out.

## 2012-12-21 NOTE — ED Provider Notes (Signed)
CSN: [id number]     Arrival date & time 12/21/12  4098 History   First MD Initiated Contact with Patient 12/21/12 1038     Chief Complaint  Patient presents with  . Abdominal Pain  . Exposure to STD   (Consider location/radiation/quality/duration/timing/severity/associated sxs/prior Treatment) Patient is a 32 y.o. female presenting with vaginal discharge. The history is provided by the patient. No language interpreter was used.  Vaginal Discharge Quality:  White Severity:  Mild Timing:  Constant Chronicity:  New Relieved by:  Nothing Ineffective treatments:  None tried Associated symptoms: abdominal pain    Pt request std evaluation.  Pt reports her partner has been with another person Past Medical History  Diagnosis Date  . Preterm labor   . Anemia   . Skin infection    Past Surgical History  Procedure Laterality Date  . Dilation and curettage of uterus     History reviewed. No pertinent family history. History  Substance Use Topics  . Smoking status: Current Every Day Smoker -- 0.25 packs/day    Types: Cigarettes  . Smokeless tobacco: Never Used  . Alcohol Use: No   OB History   Grav Para Term Preterm Abortions TAB SAB Ect Mult Living   8 6 5 1 2 1 1   5      Review of Systems  Gastrointestinal: Positive for abdominal pain.  Genitourinary: Positive for vaginal discharge.  All other systems reviewed and are negative.    Allergies  Review of patient's allergies indicates no known allergies.  Home Medications   Current Outpatient Rx  Name  Route  Sig  Dispense  Refill  . cephALEXin (KEFLEX) 500 MG capsule   Oral   Take 1 capsule (500 mg total) by mouth 4 (four) times daily.   28 capsule   0   . HYDROcodone-acetaminophen (NORCO/VICODIN) 5-325 MG per tablet   Oral   Take 1-2 tablets by mouth every 6 (six) hours as needed for pain.   20 tablet   0    BP 127/72  Pulse 90  Temp(Src) 98.4 F (36.9 C) (Oral)  Resp 16  SpO2 100%  LMP  12/08/2012 Physical Exam  Nursing note and vitals reviewed. Constitutional: She is oriented to person, place, and time. She appears well-developed and well-nourished.  HENT:  Head: Normocephalic.  Pulmonary/Chest: Effort normal.  Abdominal: Soft.  Genitourinary: Uterus normal. Vaginal discharge found.  Musculoskeletal: Normal range of motion.  Neurological: She is alert and oriented to person, place, and time.  Skin: Skin is warm.  Psychiatric: She has a normal mood and affect.    ED Course  Procedures (including critical care time) Labs Review Labs Reviewed  POCT URINALYSIS DIP (DEVICE) - Abnormal; Notable for the following:    Ketones, ur TRACE (*)    All other components within normal limits   Imaging Review No results found.  MDM   1. Bacterial vaginitis    Flagyl     Elson Areas, PA-C 12/21/12 1220

## 2012-12-22 NOTE — ED Provider Notes (Signed)
Medical screening examination/treatment/procedure(s) were performed by resident physician or non-physician practitioner and as supervising physician I was immediately available for consultation/collaboration.   Ambrose Wile DOUGLAS MD.   Shayaan Parke D Janelli Welling, MD 12/22/12 2114 

## 2012-12-23 ENCOUNTER — Telehealth (HOSPITAL_COMMUNITY): Payer: Self-pay | Admitting: *Deleted

## 2012-12-23 NOTE — ED Notes (Signed)
10/8 GC/Chlamydia pos., Affirm: Candida and Trich neg., Gardnerella pos. Pt. adequately treated with Flagyl for bacterial vaginosis.  Message sent to [employer] PA.  She sent message back to treat pt. with Rocephin and Zithromax.  10/9 I called pt. Pt. verified x 2 and given results.  Pt. told she was adequately treated for bacterial vaginosis with Flagyl and needs to come back for Rocephin and Zithromax. Pt. instructed to notify her partner, no sex for 1 week and to practice safe sex. Pt. told they can get HIV testing at the [facility]. STD clinic, by appointment.  Pt. told we would be open until 2000.  Pt. voiced understanding. Vassie Moselle 12/23/2012

## 2012-12-24 ENCOUNTER — Telehealth (HOSPITAL_COMMUNITY): Payer: Self-pay | Admitting: *Deleted

## 2012-12-24 NOTE — ED Notes (Signed)
I called pt. and asked when she was coming for treatment.  She said she had to work both jobs yesterday, but would come today. Pt. asked how long was the wait. I told her no wait right now if she comes right away. Christine Ingram 12/24/2012

## 2012-12-28 NOTE — ED Notes (Addendum)
I called Brandi Sessoms at the [facility] and left a message that pt. has been contacted twice on 10/9 and 10/10 to come in for treatment of GC and Chlamydia.  The pt. told me both times she would come the next day and has not come yet for treatment.  [facility] forms x 2 sent as untreated. Vassie Moselle 12/28/2012 Discussed with Prentice Docker at the [facility].  She said she got my message and tried to call pt. this morning.  She will continue to try and contact pt. and find out if there are any barriers for her to get treatment. Vassie Moselle 12/29/2012

## 2013-01-03 ENCOUNTER — Telehealth (HOSPITAL_COMMUNITY): Payer: Self-pay | Admitting: *Deleted

## 2013-01-05 ENCOUNTER — Encounter (HOSPITAL_COMMUNITY): Payer: Self-pay | Admitting: Emergency Medicine

## 2013-01-05 ENCOUNTER — Emergency Department (INDEPENDENT_AMBULATORY_CARE_PROVIDER_SITE_OTHER)
Admission: EM | Admit: 2013-01-05 | Discharge: 2013-01-05 | Disposition: A | Discharge status: Home / Self Care | Payer: Self-pay | Source: Home / Self Care | Attending: Emergency Medicine | Admitting: Emergency Medicine

## 2013-01-05 DIAGNOSIS — A749 Chlamydial infection, unspecified: Secondary | ICD-10-CM

## 2013-01-05 DIAGNOSIS — A5400 Gonococcal infection of lower genitourinary tract, unspecified: Secondary | ICD-10-CM

## 2013-01-05 DIAGNOSIS — A549 Gonococcal infection, unspecified: Secondary | ICD-10-CM

## 2013-01-05 MED ORDER — AZITHROMYCIN 250 MG PO TABS
ORAL_TABLET | ORAL | Status: AC
  Filled 2013-01-05: qty 4

## 2013-01-05 MED ORDER — AZITHROMYCIN 250 MG PO TABS
1000.0000 mg | ORAL_TABLET | Freq: Once | ORAL | Status: AC
  Administered 2013-01-05: 1000 mg via ORAL

## 2013-01-05 MED ORDER — CEFTRIAXONE SODIUM 250 MG IJ SOLR
INTRAMUSCULAR | Status: AC
  Filled 2013-01-05: qty 250

## 2013-01-05 MED ORDER — LIDOCAINE HCL (PF) 1 % IJ SOLN
INTRAMUSCULAR | Status: AC
  Filled 2013-01-05: qty 5

## 2013-01-05 MED ORDER — CEFTRIAXONE SODIUM 250 MG IJ SOLR
250.0000 mg | Freq: Once | INTRAMUSCULAR | Status: AC
  Administered 2013-01-05: 250 mg via INTRAMUSCULAR

## 2013-01-05 NOTE — ED Provider Notes (Signed)
Chief Complaint:   Chief Complaint  Patient presents with  . Exposure to STD    History of Present Illness:   Christine Ingram is a 32 year old female who was seen here on October 7 for vaginal discharge. At that time, routine DNA probes were obtained. She was diagnosed with bacterial vaginosis and treated with Flagyl. The patient states the discharge got better although there is still some slight residual discharge. She did have some pelvic pain initially but this also got better with the Flagyl. She did not have any medication side effects. Her DNA probes came back also positive for gonorrhea and Chlamydia and she comes in today for treatment. She denies any symptoms such as abdominal pain, pelvic pain, fever, chills, nausea, vomiting, or urinary symptoms.  Review of Systems:  Other than noted above, the patient denies any of the following symptoms: Systemic:  No fevers, chills, sweats, weight loss or gain, fatigue, or tiredness.  PMFSH:  Past medical history, family history, social history, meds, and allergies were reviewed.    Physical Exam:   Vital signs:  BP 134/87  Pulse 77  Temp(Src) 98.1 F (36.7 C) (Oral)  Resp 16  SpO2 97%  LMP 12/08/2012 General:  Alert and oriented.  In no distress.  Skin warm and dry.  Course in Urgent Care Center:   Given Rocephin 250 mg IM and azithromycin 1000 mg by mouth. Serologies for HIV and syphilis also obtained.  Assessment:  The primary encounter diagnosis was Gonorrhea. A diagnosis of Chlamydia was also pertinent to this visit.  Plan:   1.  Meds:  The following meds were prescribed:   New Prescriptions   No medications on file    2.  Patient Education/Counseling:  The patient was given appropriate handouts, self care instructions, and instructed in symptomatic relief.  Patient was advised to abstain from intercourse for the next 2 weeks. She will return again in 2 weeks for a repeat gonorrhea and Chlamydia test, just to make sure that these  are gone completely. After that, always use condoms for intercourse. Will call her back about results of HIV and syphilis serologies.  3.  Follow up:  The patient was told to follow up if no better in 3 to 4 days, if becoming worse in any way, and given some red flag symptoms such as fever or pelvic pain which would prompt immediate return.  Follow up here in 2 weeks for a repeat gonorrhea and Chlamydia DNA probe.      Reuben Likes, MD 01/05/13 1710

## 2013-01-05 NOTE — ED Notes (Signed)
I called Brandi Sessoms at the [facility] and told her pt. was here for treatment.  She will get Rocephin 250 mg. IM and Zithromax 1000 mg. If any further treatment I will let her know. Christine Ingram 01/05/2013

## 2013-01-05 NOTE — ED Notes (Signed)
Pt is here to be treated for Gc/Chlam Seen here on 12/18/12... rec'd call from our office for pos Gc/Chlam Voices no new concerns... Alert w/no signs of acute distress.

## 2013-12-01 LAB — OB RESULTS CONSOLE RUBELLA ANTIBODY, IGM: Rubella: IMMUNE

## 2013-12-01 LAB — PROCEDURE REPORT - SCANNED: PAP SMEAR: NEGATIVE

## 2013-12-01 LAB — OB RESULTS CONSOLE HEPATITIS B SURFACE ANTIGEN: Hepatitis B Surface Ag: NEGATIVE

## 2013-12-01 LAB — OB RESULTS CONSOLE GC/CHLAMYDIA: Gonorrhea: NEGATIVE

## 2013-12-01 LAB — OB RESULTS CONSOLE RPR: RPR: NONREACTIVE

## 2013-12-01 LAB — OB RESULTS CONSOLE HIV ANTIBODY (ROUTINE TESTING): HIV: NONREACTIVE

## 2014-01-16 ENCOUNTER — Encounter (HOSPITAL_COMMUNITY): Payer: Self-pay | Admitting: Emergency Medicine

## 2014-03-17 NOTE — L&D Delivery Note (Signed)
Delivery Note At 4:35 PM a viable female was delivered via Vaginal, Spontaneous Delivery (Presentation: Left Occiput Anterior).  APGAR: 8, 7; weight 5 lb 3.8 oz (2375 g).   Placenta status: Intact, Spontaneous.  Cord: 3 vessels with the following complications: None.  Cord pH: none  Anesthesia: Epidural  Episiotomy: None Lacerations: None Suture Repair: none Est. Blood Loss (mL): 350  Mom to postpartum.  Baby to Couplet care / Skin to Skin.  Secret Kristensen A 05/28/2014, 7:38 PM

## 2014-05-17 LAB — OB RESULTS CONSOLE GBS: GBS: POSITIVE

## 2014-05-20 ENCOUNTER — Encounter (HOSPITAL_COMMUNITY): Payer: Self-pay | Admitting: *Deleted

## 2014-05-20 ENCOUNTER — Inpatient Hospital Stay (HOSPITAL_COMMUNITY)
Admission: AD | Admit: 2014-05-20 | Discharge: 2014-05-20 | Disposition: A | Discharge status: Home / Self Care | Payer: Medicaid Other | Source: Ambulatory Visit | Attending: Obstetrics | Admitting: Obstetrics

## 2014-05-20 DIAGNOSIS — Z3A35 35 weeks gestation of pregnancy: Secondary | ICD-10-CM | POA: Insufficient documentation

## 2014-05-20 DIAGNOSIS — R208 Other disturbances of skin sensation: Secondary | ICD-10-CM | POA: Insufficient documentation

## 2014-05-20 DIAGNOSIS — O9989 Other specified diseases and conditions complicating pregnancy, childbirth and the puerperium: Secondary | ICD-10-CM

## 2014-05-20 DIAGNOSIS — Z87891 Personal history of nicotine dependence: Secondary | ICD-10-CM | POA: Insufficient documentation

## 2014-05-20 DIAGNOSIS — R202 Paresthesia of skin: Secondary | ICD-10-CM

## 2014-05-20 DIAGNOSIS — O26893 Other specified pregnancy related conditions, third trimester: Secondary | ICD-10-CM | POA: Insufficient documentation

## 2014-05-20 NOTE — MAU Note (Addendum)
Pt. Here for evaluation of contractions. States she has been contracting for a month and today they became stronger around 10am. States contracting every 5mins. Denies leakage of fluid or bleeding. Notes has not been noticing baby movement as much due to contractions. Last OB appointment was Wednesday of this week and she was 1cm. Pt. Is also concerned about numbness that she feels is getting worse in her right arm.

## 2014-05-20 NOTE — MAU Provider Note (Signed)
History     CSN: [id number]  Arrival date and time: 05/20/14 1723   None     No chief complaint on file.  HPI 34 y.o. [id number] at [redacted]w[redacted]d w/ mild contractions, states she has had them "for a long time", no worse today. Also c/o numbness and tingling intermittently in right arm. Has been ongoing for some time in this pregnancy, previously only occurred w/ lying down, now occurs randomly throughout the day, no aggravating or alleviating factors. Uncomfortable, but no loss of function in arm.   Past Medical History  Diagnosis Date  . Preterm labor   . Anemia   . Skin infection     Past Surgical History  Procedure Laterality Date  . Dilation and curettage of uterus      History reviewed. No pertinent family history.  History  Substance Use Topics  . Smoking status: Former Smoker -- 0.25 packs/day    Types: Cigarettes    Quit date: 01/16/2014  . Smokeless tobacco: Never Used  . Alcohol Use: No    Allergies: No Known Allergies  Prescriptions prior to admission  Medication Sig Dispense Refill Last Dose  . Prenatal Vit-Fe Fumarate-FA (MULTIVITAMIN-PRENATAL) 27-0.8 MG TABS tablet Take 1 tablet by mouth daily at 12 noon.   05/18/2014  . cephALEXin (KEFLEX) 500 MG capsule Take 1 capsule (500 mg total) by mouth 4 (four) times daily. (Patient not taking: Reported on 05/20/2014) 28 capsule 0 Unknown at Unknown time  . HYDROcodone-acetaminophen (NORCO/VICODIN) 5-325 MG per tablet Take 1-2 tablets by mouth every 6 (six) hours as needed for pain. (Patient not taking: Reported on 05/20/2014) 20 tablet 0 Unknown at Unknown time  . metroNIDAZOLE (FLAGYL) 500 MG tablet Take 1 tablet (500 mg total) by mouth 2 (two) times daily. (Patient not taking: Reported on 05/20/2014) 14 tablet 0 Unknown at Unknown time    Review of Systems  Constitutional: Negative.   Respiratory: Negative.   Cardiovascular: Negative.   Gastrointestinal: Negative for nausea, vomiting, abdominal pain, diarrhea and constipation.   Genitourinary: Negative for dysuria, urgency, frequency, hematuria and flank pain.       Negative for vaginal bleeding, + cramping/contractions  Musculoskeletal: Negative.   Neurological: Positive for tingling.  Psychiatric/Behavioral: Negative.    Physical Exam   Blood pressure 127/82, pulse 92, resp. rate 18, height  (1.575 m), weight 158 lb 12.8 oz (72.031 kg), SpO2 99 %.  Physical Exam  Constitutional: She is oriented to person, place, and time.  GI: Soft. There is no tenderness.  Genitourinary:  Dilation: 1.5 Effacement (%): Thick Cervical Position: Posterior Station: Ballotable Presentation: Undeterminable Exam by:: Sarajane Marek, RN   Musculoskeletal: Normal range of motion. She exhibits no edema or tenderness.  Normal strength, tone, ROM in R arm  Neurological: She is alert and oriented to person, place, and time. No cranial nerve deficit. She exhibits normal muscle tone. Coordination normal.   Reactive tracing, TOCO quiet MAU Course  Procedures No results found for this or any previous visit (from the past 24 hour(s)).   Assessment and Plan   1. Paresthesia of right arm   Likely s/t normal pregnancy changes, rev'd precautions and comfort measures, no evidence of labor, f/u PRN or as scheduled    Medication List    STOP taking these medications        cephALEXin 500 MG capsule  Commonly known as:  KEFLEX     HYDROcodone-acetaminophen 5-325 MG per tablet  Commonly known as:  NORCO/VICODIN  metroNIDAZOLE 500 MG tablet  Commonly known as:  FLAGYL      TAKE these medications        multivitamin-prenatal 27-0.8 MG Tabs tablet  Take 1 tablet by mouth daily at 12 noon.            Follow-up Information    Follow up with [name],BERNARD A, MD.   Specialty:  Obstetrics and Gynecology   Contact information:   [address] [place] [address] [phone]         [facility] HospitalFRAZIER,NATALIE 05/20/2014, 6:15 PM

## 2014-05-28 ENCOUNTER — Encounter (HOSPITAL_COMMUNITY): Payer: Self-pay | Admitting: *Deleted

## 2014-05-28 ENCOUNTER — Inpatient Hospital Stay (HOSPITAL_COMMUNITY): Payer: Medicaid Other

## 2014-05-28 ENCOUNTER — Inpatient Hospital Stay (HOSPITAL_COMMUNITY): Payer: Medicaid Other | Admitting: Anesthesiology

## 2014-05-28 ENCOUNTER — Inpatient Hospital Stay (HOSPITAL_COMMUNITY)
Admission: AD | Admit: 2014-05-28 | Discharge: 2014-05-30 | DRG: 775 | Disposition: A | Discharge status: Home / Self Care | Payer: Medicaid Other | Source: Ambulatory Visit | Attending: Obstetrics | Admitting: Obstetrics

## 2014-05-28 DIAGNOSIS — Z3A36 36 weeks gestation of pregnancy: Secondary | ICD-10-CM | POA: Diagnosis present

## 2014-05-28 DIAGNOSIS — IMO0001 Reserved for inherently not codable concepts without codable children: Secondary | ICD-10-CM

## 2014-05-28 DIAGNOSIS — Z3483 Encounter for supervision of other normal pregnancy, third trimester: Secondary | ICD-10-CM | POA: Diagnosis present

## 2014-05-28 DIAGNOSIS — Z3689 Encounter for other specified antenatal screening: Secondary | ICD-10-CM | POA: Insufficient documentation

## 2014-05-28 LAB — TYPE AND SCREEN
ABO/RH(D): A POS
Antibody Screen: NEGATIVE

## 2014-05-28 LAB — CBC
HCT: 31.7 % — ABNORMAL LOW (ref 36.0–46.0)
HEMOGLOBIN: 10.4 g/dL — AB (ref 12.0–15.0)
MCH: 29.5 pg (ref 26.0–34.0)
MCHC: 32.8 g/dL (ref 30.0–36.0)
MCV: 89.8 fL (ref 78.0–100.0)
Platelets: 125 10*3/uL — ABNORMAL LOW (ref 150–400)
RBC: 3.53 MIL/uL — AB (ref 3.87–5.11)
RDW: 13.6 % (ref 11.5–15.5)
WBC: 5.9 10*3/uL (ref 4.0–10.5)

## 2014-05-28 LAB — ABO/RH: ABO/RH(D): A POS

## 2014-05-28 LAB — RPR: RPR: NONREACTIVE

## 2014-05-28 MED ORDER — WITCH HAZEL-GLYCERIN EX PADS: 1.0000 "application " | MEDICATED_PAD | CUTANEOUS | Status: DC | PRN

## 2014-05-28 MED ORDER — OXYCODONE-ACETAMINOPHEN 5-325 MG PO TABS: 2.0000 | ORAL_TABLET | ORAL | Status: DC | PRN

## 2014-05-28 MED ORDER — NIFEDIPINE 10 MG PO CAPS
10.0000 mg | ORAL_CAPSULE | Freq: Once | ORAL | Status: AC
  Administered 2014-05-28: 10 mg via ORAL
  Filled 2014-05-28: qty 1

## 2014-05-28 MED ORDER — ONDANSETRON HCL 4 MG PO TABS: 4.0000 mg | ORAL_TABLET | ORAL | Status: DC | PRN

## 2014-05-28 MED ORDER — OXYCODONE-ACETAMINOPHEN 5-325 MG PO TABS: 1.0000 | ORAL_TABLET | ORAL | Status: DC | PRN

## 2014-05-28 MED ORDER — TETANUS-DIPHTH-ACELL PERTUSSIS 5-2.5-18.5 LF-MCG/0.5 IM SUSP
0.5000 mL | Freq: Once | INTRAMUSCULAR | Status: AC
  Administered 2014-05-29: 0.5 mL via INTRAMUSCULAR

## 2014-05-28 MED ORDER — CITRIC ACID-SODIUM CITRATE 334-500 MG/5ML PO SOLN
30.0000 mL | ORAL | Status: DC | PRN
Start: 2014-05-28 — End: 2014-05-28

## 2014-05-28 MED ORDER — ZOLPIDEM TARTRATE 5 MG PO TABS: 5.0000 mg | ORAL_TABLET | Freq: Every evening | ORAL | Status: DC | PRN

## 2014-05-28 MED ORDER — IBUPROFEN 600 MG PO TABS
600.0000 mg | ORAL_TABLET | Freq: Four times a day (QID) | ORAL | Status: DC
  Administered 2014-05-28 – 2014-05-30 (×8): 600 mg via ORAL
  Filled 2014-05-28 (×8): qty 1

## 2014-05-28 MED ORDER — BUTORPHANOL TARTRATE 1 MG/ML IJ SOLN: 1.0000 mg | INTRAMUSCULAR | Status: DC | PRN

## 2014-05-28 MED ORDER — OXYTOCIN 40 UNITS IN LACTATED RINGERS INFUSION - SIMPLE MED
62.5000 mL/h | INTRAVENOUS | Status: DC
  Administered 2014-05-28: 62.5 mL/h via INTRAVENOUS
  Filled 2014-05-28: qty 1000

## 2014-05-28 MED ORDER — LANOLIN HYDROUS EX OINT: TOPICAL_OINTMENT | CUTANEOUS | Status: DC | PRN

## 2014-05-28 MED ORDER — DIPHENHYDRAMINE HCL 50 MG/ML IJ SOLN: 12.5000 mg | INTRAMUSCULAR | Status: DC | PRN

## 2014-05-28 MED ORDER — ONDANSETRON HCL 4 MG/2ML IJ SOLN: 4.0000 mg | INTRAMUSCULAR | Status: DC | PRN

## 2014-05-28 MED ORDER — OXYTOCIN 40 UNITS IN LACTATED RINGERS INFUSION - SIMPLE MED
1.0000 m[IU]/min | INTRAVENOUS | Status: DC
  Administered 2014-05-28: 1 m[IU]/min via INTRAVENOUS

## 2014-05-28 MED ORDER — ACETAMINOPHEN 325 MG PO TABS: 650.0000 mg | ORAL_TABLET | ORAL | Status: DC | PRN

## 2014-05-28 MED ORDER — PHENYLEPHRINE 40 MCG/ML (10ML) SYRINGE FOR IV PUSH (FOR BLOOD PRESSURE SUPPORT)
80.0000 ug | PREFILLED_SYRINGE | INTRAVENOUS | Status: DC | PRN
  Filled 2014-05-28: qty 2

## 2014-05-28 MED ORDER — PENICILLIN G POTASSIUM 5000000 UNITS IJ SOLR
5.0000 10*6.[IU] | Freq: Once | INTRAVENOUS | Status: AC
  Administered 2014-05-28: 5 10*6.[IU] via INTRAVENOUS
  Filled 2014-05-28: qty 5

## 2014-05-28 MED ORDER — LACTATED RINGERS IV BOLUS (SEPSIS)
1000.0000 mL | Freq: Once | INTRAVENOUS | Status: AC
  Administered 2014-05-28: 1000 mL via INTRAVENOUS

## 2014-05-28 MED ORDER — PENICILLIN G POTASSIUM 5000000 UNITS IJ SOLR
2.5000 10*6.[IU] | INTRAVENOUS | Status: DC
  Administered 2014-05-28 (×2): 2.5 10*6.[IU] via INTRAVENOUS
  Filled 2014-05-28 (×6): qty 2.5

## 2014-05-28 MED ORDER — OXYTOCIN 40 UNITS IN LACTATED RINGERS INFUSION - SIMPLE MED: 62.5000 mL/h | INTRAVENOUS | Status: DC | PRN

## 2014-05-28 MED ORDER — DIBUCAINE 1 % RE OINT: 1.0000 "application " | TOPICAL_OINTMENT | RECTAL | Status: DC | PRN

## 2014-05-28 MED ORDER — FENTANYL 2.5 MCG/ML BUPIVACAINE 1/10 % EPIDURAL INFUSION (WH - ANES)
14.0000 mL/h | INTRAMUSCULAR | Status: DC | PRN
  Administered 2014-05-28: 14 mL/h via EPIDURAL
  Filled 2014-05-28: qty 125

## 2014-05-28 MED ORDER — EPHEDRINE 5 MG/ML INJ
10.0000 mg | INTRAVENOUS | Status: DC | PRN
  Filled 2014-05-28: qty 2

## 2014-05-28 MED ORDER — TERBUTALINE SULFATE 1 MG/ML IJ SOLN
0.2500 mg | Freq: Once | INTRAMUSCULAR | Status: DC | PRN
  Filled 2014-05-28: qty 1

## 2014-05-28 MED ORDER — OXYTOCIN BOLUS FROM INFUSION
500.0000 mL | INTRAVENOUS | Status: DC
  Administered 2014-05-28: 500 mL via INTRAVENOUS

## 2014-05-28 MED ORDER — LIDOCAINE HCL (PF) 1 % IJ SOLN
30.0000 mL | INTRAMUSCULAR | Status: DC | PRN
  Filled 2014-05-28: qty 30

## 2014-05-28 MED ORDER — SENNOSIDES-DOCUSATE SODIUM 8.6-50 MG PO TABS
2.0000 | ORAL_TABLET | ORAL | Status: DC
  Administered 2014-05-29 (×2): 2 via ORAL
  Filled 2014-05-28 (×2): qty 2

## 2014-05-28 MED ORDER — LACTATED RINGERS IV SOLN: 500.0000 mL | INTRAVENOUS | Status: DC | PRN

## 2014-05-28 MED ORDER — BENZOCAINE-MENTHOL 20-0.5 % EX AERO
1.0000 "application " | INHALATION_SPRAY | CUTANEOUS | Status: DC | PRN
  Administered 2014-05-29: 1 via TOPICAL
  Filled 2014-05-28: qty 56

## 2014-05-28 MED ORDER — FLEET ENEMA 7-19 GM/118ML RE ENEM: 1.0000 | ENEMA | RECTAL | Status: DC | PRN

## 2014-05-28 MED ORDER — FENTANYL 2.5 MCG/ML BUPIVACAINE 1/10 % EPIDURAL INFUSION (WH - ANES)
INTRAMUSCULAR | Status: DC | PRN
  Administered 2014-05-28: 14 mL/h via EPIDURAL

## 2014-05-28 MED ORDER — LIDOCAINE HCL (PF) 1 % IJ SOLN
INTRAMUSCULAR | Status: DC | PRN
  Administered 2014-05-28 (×2): 4 mL

## 2014-05-28 MED ORDER — ONDANSETRON HCL 4 MG/2ML IJ SOLN: 4.0000 mg | Freq: Four times a day (QID) | INTRAMUSCULAR | Status: DC | PRN

## 2014-05-28 MED ORDER — LACTATED RINGERS IV SOLN
500.0000 mL | Freq: Once | INTRAVENOUS | Status: AC
  Administered 2014-05-28: 500 mL via INTRAVENOUS

## 2014-05-28 MED ORDER — PRENATAL MULTIVITAMIN CH
1.0000 | ORAL_TABLET | Freq: Every day | ORAL | Status: DC
  Administered 2014-05-29 – 2014-05-30 (×2): 1 via ORAL
  Filled 2014-05-28 (×2): qty 1

## 2014-05-28 MED ORDER — LACTATED RINGERS IV SOLN
INTRAVENOUS | Status: DC
  Administered 2014-05-28: 13:00:00 via INTRAVENOUS

## 2014-05-28 MED ORDER — DIPHENHYDRAMINE HCL 25 MG PO CAPS: 25.0000 mg | ORAL_CAPSULE | Freq: Four times a day (QID) | ORAL | Status: DC | PRN

## 2014-05-28 MED ORDER — NIFEDIPINE 10 MG PO CAPS: 10.0000 mg | ORAL_CAPSULE | Freq: Once | ORAL | Status: DC

## 2014-05-28 MED ORDER — OXYCODONE-ACETAMINOPHEN 5-325 MG PO TABS
2.0000 | ORAL_TABLET | ORAL | Status: DC | PRN
Start: 2014-05-28 — End: 2014-05-28

## 2014-05-28 MED ORDER — SIMETHICONE 80 MG PO CHEW: 80.0000 mg | CHEWABLE_TABLET | ORAL | Status: DC | PRN

## 2014-05-28 NOTE — Progress Notes (Signed)
Christine Ingram is a 34 y.o. [id number] at 7128w6d by LMP admitted for active labor  Subjective:   Objective: BP 135/87 mmHg  Pulse 93  Temp(Src) 98.2 F (36.8 C) (Oral)  Resp 18  Ht 5\' 2"  (1.575 m)  Wt 162 lb (73.483 kg)  BMI 29.62 kg/m2  SpO2 100%  Breastfeeding? Unknown I/O last 3 completed shifts: In: -  Out: 100 [Urine:100] Total I/O In: -  Out: 1000 [Urine:1000]  FHT:  FHR: 150 bpm, variability: moderate,  accelerations:  Present,  decelerations:  Present variable UC:   regular, every 2-3  minutes SVE:   Dilation: 10 Effacement (%): 100 Station: +2 Exam by:: Enis SlipperJane Bailey, RN  Labs: Lab Results  Component Value Date   WBC 5.9 05/28/2014   HGB 10.4* 05/28/2014   HCT 31.7* 05/28/2014   MCV 89.8 05/28/2014   PLT 125* 05/28/2014    Assessment / Plan: Augmentation of labor, progressing well  Labor: Progressing normally Preeclampsia:  n/a Fetal Wellbeing:  Category I Pain Control:  Epidural I/D:  n/a Anticipated MOD:  NSVD  Candy Leverett A 05/28/2014, 4:29 PM

## 2014-05-28 NOTE — MAU Note (Signed)
Report called to Artist Paisana, Charge RN birthing suites.  Patient to be admitted to room 169.

## 2014-05-28 NOTE — H&P (Signed)
Christine Ingram is a 34 y.o. female presenting for UC's. Maternal Medical History:  Reason for admission: Contractions.   Contractions: Onset was 6-12 hours ago.    Fetal activity: Perceived fetal activity is normal.   Last perceived fetal movement was within the past hour.    Prenatal complications: no prenatal complications Prenatal Complications - Diabetes: none.    OB History    Gravida Para Term Preterm AB TAB SAB Ectopic Multiple Living   9 6 5 1 2 1 1   5      Past Medical History  Diagnosis Date  . Preterm labor   . Anemia   . Skin infection    Past Surgical History  Procedure Laterality Date  . Dilation and curettage of uterus     Family History: family history is not on file. Social History:  reports that she quit smoking about 4 months ago. Her smoking use included Cigarettes. She smoked 0.25 packs per day. She has never used smokeless tobacco. She reports that she does not drink alcohol or use illicit drugs.   Prenatal Transfer Tool  Maternal Diabetes: No Genetic Screening: Normal Maternal Ultrasounds/Referrals: Normal Fetal Ultrasounds or other Referrals:  None Maternal Substance Abuse:  No Significant Maternal Medications:  None Significant Maternal Lab Results:  None Other Comments:  None  Review of Systems  All other systems reviewed and are negative.   Dilation: 6 Effacement (%): 90 Station: -1 Exam by:: Veronica Mensah Blood pressure 126/82, pulse 70, temperature 98.2 F (36.8 C), resp. rate 18, height 5\' 2"  (1.575 m), weight 162 lb 3.2 oz (73.573 kg). Maternal Exam:  Uterine Assessment: Contraction strength is moderate.  Abdomen: Patient reports no abdominal tenderness. Fetal presentation: vertex  Introitus: Normal vulva. Normal vagina.  Pelvis: adequate for delivery.   Cervix: Cervix evaluated by digital exam.     Physical Exam  Nursing note and vitals reviewed. Constitutional: She is oriented to person, place, and time. She appears  well-developed and well-nourished.  HENT:  Head: Normocephalic and atraumatic.  Eyes: Conjunctivae are normal. Pupils are equal, round, and reactive to light.  Neck: Normal range of motion. Neck supple.  Cardiovascular: Normal rate and regular rhythm.   Respiratory: Effort normal and breath sounds normal.  GI: Soft.  Genitourinary: Vagina normal and uterus normal.  Musculoskeletal: Normal range of motion.  Neurological: She is alert and oriented to person, place, and time.  Skin: Skin is warm and dry.  Psychiatric: She has a normal mood and affect. Her behavior is normal. Judgment and thought content normal.    Prenatal labs: ABO, Rh: --/--/A POS (03/13 [id number]) Antibody: PENDING (03/13 0525) Rubella:   RPR:    HBsAg:    HIV:    GBS:     Assessment/Plan: 36.6 weeks.  Active labor.  Admit.   HARPER,CHARLES A 05/28/2014, 6:03 AM

## 2014-05-28 NOTE — Lactation Note (Signed)
This note was copied from the chart of Christine Donny Piqueemeisha Olesen. Lactation Consultation Note  Patient Name: Christine Ingram ZOXWR'UToday's Date: 05/28/2014 Reason for consult: Initial assessment;Late preterm infant This is mom's first baby and he was born LPI at 9536 weeks and mom states she wants to pump and bottle-feed for now.  DEBP was initiated as well as hand expression instructions, per RN, Christine Ingram.  LC encouraged q3h pumping and provided LPI parent handout for special feeding and care guidelines.  LC also encouraged frequent STS and q3h feedings.  LC encouraged review of Baby and Me pp 9, 14 and 20-25 for STS and BF information. LC provided [employer] Resource brochure and reviewed [facility] and list of community and web site resources.   Maternal Data Formula Feeding for Exclusion: Yes Reason for exclusion: Mother's choice to formula and breast feed on admission Has patient been taught Hand Expression?: Yes (per RN, Christine Ingram) Does the patient have breastfeeding experience prior to this delivery?: No  Feeding Feeding Type: Bottle Fed - Formula Nipple Type: Slow - flow  LATCH Score/Interventions         N/A - will pump and bottle-feed             Lactation Tools Discussed/Used WIC Program: Yes Pump Review: Setup, frequency, and cleaning;Milk Storage Initiated by:: RN Date initiated:: 05/28/14 STS, hand expression and q3h pumping  Consult Status Consult Status: Follow-up Date: 05/29/14 Follow-up type: In-patient    Warrick ParisianBryant, Christine Tuckett [facility] 05/28/2014, 11:21 PM

## 2014-05-28 NOTE — Anesthesia Preprocedure Evaluation (Signed)
Anesthesia Evaluation  Patient identified by MRN, date of birth, ID band Patient awake    Reviewed: Allergy & Precautions, NPO status , Patient's Chart, lab work & pertinent test results  History of Anesthesia Complications Negative for: history of anesthetic complications  Airway Mallampati: II  TM Distance: >3 FB Neck ROM: Full    Dental no notable dental hx. (+) Dental Advisory Given   Pulmonary former smoker,  breath sounds clear to auscultation  Pulmonary exam normal       Cardiovascular negative cardio ROS  Rhythm:Regular Rate:Normal     Neuro/Psych negative neurological ROS  negative psych ROS   GI/Hepatic negative GI ROS, Neg liver ROS,   Endo/Other  negative endocrine ROS  Renal/GU negative Renal ROS  negative genitourinary   Musculoskeletal negative musculoskeletal ROS (+)   Abdominal   Peds negative pediatric ROS (+)  Hematology  (+) anemia ,   Anesthesia Other Findings   Reproductive/Obstetrics (+) Pregnancy                             Anesthesia Physical Anesthesia Plan  ASA: II  Anesthesia Plan: Epidural   Post-op Pain Management:    Induction:   Airway Management Planned:   Additional Equipment:   Intra-op Plan:   Post-operative Plan:   Informed Consent: I have reviewed the patients History and Physical, chart, labs and discussed the procedure including the risks, benefits and alternatives for the proposed anesthesia with the patient or authorized representative who has indicated his/her understanding and acceptance.   Dental advisory given  Plan Discussed with:   Anesthesia Plan Comments:         Anesthesia Quick Evaluation

## 2014-05-28 NOTE — MAU Note (Signed)
Contractions all day but ctxs woke me up at 12mn. Some vag bleeding

## 2014-05-29 DIAGNOSIS — Z3689 Encounter for other specified antenatal screening: Secondary | ICD-10-CM | POA: Insufficient documentation

## 2014-05-29 DIAGNOSIS — Z3A36 36 weeks gestation of pregnancy: Secondary | ICD-10-CM | POA: Insufficient documentation

## 2014-05-29 LAB — CBC
HCT: 26.4 % — ABNORMAL LOW (ref 36.0–46.0)
Hemoglobin: 8.5 g/dL — ABNORMAL LOW (ref 12.0–15.0)
MCH: 28.7 pg (ref 26.0–34.0)
MCHC: 32.2 g/dL (ref 30.0–36.0)
MCV: 89.2 fL (ref 78.0–100.0)
Platelets: 122 10*3/uL — ABNORMAL LOW (ref 150–400)
RBC: 2.96 MIL/uL — AB (ref 3.87–5.11)
RDW: 13.6 % (ref 11.5–15.5)
WBC: 14.1 10*3/uL — ABNORMAL HIGH (ref 4.0–10.5)

## 2014-05-29 MED ORDER — PNEUMOCOCCAL VAC POLYVALENT 25 MCG/0.5ML IJ INJ
0.5000 mL | INJECTION | INTRAMUSCULAR | Status: DC
  Filled 2014-05-29: qty 0.5

## 2014-05-29 NOTE — Progress Notes (Signed)
UR chart review completed.  

## 2014-05-29 NOTE — Progress Notes (Signed)
Patient ID: Christine Ingram, female   DOB: 1981/02/17, 34 y.o.   MRN: [id number] Pp 1 Vs nl  fundus firm  Lochia mod  Legs neg

## 2014-05-29 NOTE — Anesthesia Postprocedure Evaluation (Signed)
Anesthesia Post Note  Patient: Christine Ingram  Procedure(s) Performed: * No procedures listed *  Anesthesia type: Epidural  Patient location: Mother/Baby  Post pain: Pain level controlled  Post assessment: Post-op Vital signs reviewed  Last Vitals:  Filed Vitals:   05/29/14 0555  BP: 116/86  Pulse: 69  Temp: 36.8 C  Resp: 20    Post vital signs: Reviewed  Level of consciousness:alert  Complications: No apparent anesthesia complications

## 2014-05-29 NOTE — Lactation Note (Addendum)
This note was copied from the chart of Christine Donny Piqueemeisha Honsinger. Lactation Consultation Note  Patient Name: Christine Ingram NFAOZ'HToday's Date: 05/29/2014 Reason for consult: Follow-up assessment;Late preterm infant;Infant < 6lbs LPI 21 hours of life. Mom states that she has not pumped again since early this morning with LC's assistance. However, mom states that she intends to start pumping momentarily. Reviewed supply and demand and enc to pump every 3 hours as mom states that it is her goal to pump and give EBM with bottle only. [facility] so that she can get a pump. Discussed WIC loaner with mom depending on her appointment and continued desire to pump after discharge. Mom states that her breasts are filling, but she is not feeling any of the hardened areas that she had earlier. Enc mom to call for assistance as needed.   Maternal Data    Feeding Feeding Type: Formula  LATCH Score/Interventions                      Lactation Tools Discussed/Used Tools: Pump;Bottle   Consult Status Consult Status: Follow-up Date: 05/30/14 Follow-up type: In-patient    Geralynn OchsWILLIARD, Hanya Guerin 05/29/2014, 1:53 PM

## 2014-05-29 NOTE — Lactation Note (Signed)
This note was copied from the chart of Christine Ingram Raver. Lactation Consultation Note Mom had so much difficulty last time she tried to BF she became so frustrated, she decided she wasn't going through that anymore, but she did want to give her baby her colostrum and breast milk by pumping and bottle feeding. Mom stated she used shells, NS and pumping and all of that became so overwhelming.  Was encouraged to start pumping yesterday but didn't d/t excitement, company, then tired. Mom told me that she was going to start this morning. I asked if she wanted me to assist her I would, stated yes. Assessed moms breast and noted they were filling and hardened knoty areas, tender on massage. Nipples are inverted, Rt. More so than Lt. Massaged breast and hand expressed good flow of colostrum 8 ml. Gave to mom to give in bottle.  Reviewed supply and demand for pumping, engorgement, and storage of milk. Mom encouraged to pump every three hours for 15-20 min. DEBP set up and cleaning instructed. Patient Name: Christine Ingram Davidow WUJWJ'XToday's Date: 05/29/2014 Reason for consult: Follow-up assessment   Maternal Data Does the patient have breastfeeding experience prior to this delivery?: Yes  Feeding Feeding Type: Breast Milk Nipple Type: Slow - flow  LATCH Score/Interventions       Type of Nipple: Inverted Intervention(s): Double electric pump;Shells  Comfort (Breast/Nipple): Filling, red/small blisters or bruises, mild/mod discomfort  Problem noted: Filling Interventions (Filling): Double electric pump;Massage (hand expression)        Lactation Tools Discussed/Used Tools: Shells;Pump;Bottle Shell Type: Inverted Breast pump type: Double-Electric Breast Pump Initiated by:: Peri JeffersonL. Addysen Louth RN Date initiated:: 05/29/14   Consult Status Consult Status: Follow-up Date: 05/29/14 (in pm) Follow-up type: In-patient    Charyl DancerCARVER, Hurbert Duran G 05/29/2014, 4:59 AM

## 2014-05-30 ENCOUNTER — Ambulatory Visit: Payer: Self-pay

## 2014-05-30 NOTE — Discharge Instructions (Signed)
Discharge instructions   You can wash your hair  Shower  Eat what you want  Drink what you want  See me in 6 weeks  Your ankles are going to swell more in the next 2 weeks than when pregnant  No sex for 6 weeks   Deneen Slager A, MD 05/30/2014

## 2014-05-30 NOTE — Lactation Note (Signed)
This note was copied from the chart of Christine Donny Piqueemeisha Summerall. Lactation Consultation Note  P6, Mother's breasts are filling.  She was able to hand express good flow of colostrum on right breast and small amount on left. Assisted mother w/ pumping and she received 3 ml of pumped breastmilk. Reviewed volume guidelines. Mother plans to give baby pumped breastmilk and then supplement w/ formula. She understands to wake baby every 3 hours to feed. Encouraged mother to pump 8x a day.  She does not want to put baby to the breast, pump only. Reviewed engorgement care.    Patient Name: Christine Ingram ZOXWR'UToday's Date: 05/30/2014 Reason for consult: Follow-up assessment   Maternal Data    Feeding Feeding Type: Bottle Fed - Formula  LATCH Score/Interventions                      Lactation Tools Discussed/Used     Consult Status Consult Status: Follow-up Date: 05/31/14 Follow-up type: In-patient    Dahlia ByesBerkelhammer, Ruth [facility] 05/30/2014, 3:03 PM

## 2014-05-30 NOTE — Discharge Summary (Signed)
Obstetric Discharge Summary Reason for Admission: onset of labor Prenatal Procedures: none Intrapartum Procedures: spontaneous vaginal delivery Postpartum Procedures: none Complications-Operative and Postpartum: none HEMOGLOBIN  Date Value Ref Range Status  05/29/2014 8.5* 12.0 - 15.0 g/dL Final    Comment:    DELTA CHECK NOTED REPEATED TO VERIFY    HCT  Date Value Ref Range Status  05/29/2014 26.4* 36.0 - 46.0 % Final    Physical Exam:  General: alert Lochia: appropriate Uterine Fundus: firm Incision: healing well DVT Evaluation: No evidence of DVT seen on physical exam.  Discharge Diagnoses: Term Pregnancy-delivered  Discharge Information: Date: 05/30/2014 Activity: pelvic rest Diet: routine Medications: Percocet Condition: stable Instructions: refer to practice specific booklet Discharge to: home Follow-up Information    Follow up with Christine CosierMARSHALL,Christine Ingram A, MD.   Specialty:  Obstetrics and Gynecology   Contact information:   [address] [place] [address] [phone]       Newborn Data: Live born female  Birth Weight: 5 lb 3.8 oz (2375 g) APGAR: 8, 7  Home with mother.  Christine Ingram A 05/30/2014, 6:32 AM

## 2014-05-31 ENCOUNTER — Ambulatory Visit: Payer: Self-pay

## 2014-05-31 NOTE — Lactation Note (Signed)
This note was copied from the chart of Christine Ingram. Lactation Consultation Note  Mother is pumping and formula bottle feeding. Mother has only pumped 4 times in 24 hours and she is now engorged. Mother states she is getting ready to pump.  Provided her w/ ice packs and demonstrated how to use. Encouraged her to apply ice to breasts top and bottom at least every 3 hours for 15-20 min until engorgement subsides. Was told she needs to pump at least 8 times a day. Reminded her to hand express before and after pumping.  She is using 27 flanges. Provided engorgement information sheet and reviewed signs and symptoms of mastitis. Gave mother hand pump and reviewed use.  Reminded her to take her pump parts with her. Reviewed monitoring voids/stools and milk storage. WIC pump form was faxed yesterday.  Mother has received phone call and will get pump today. Reminded her that baby needs to be fed every 3 hours or sooner w/ feeding cues. Suggest she call if she has further questions or concerns.   Patient Name: Christine Donny Piqueemeisha [place] RUEAV'WToday's Date: 05/31/2014 Reason for consult: Follow-up assessment   Maternal Data    Feeding Feeding Type: Bottle Fed - Formula Nipple Type: Slow - flow  LATCH Score/Interventions                      Lactation Tools Discussed/Used Breast pump type: Double-Electric Breast Pump   Consult Status Consult Status: Complete    Hardie PulleyBerkelhammer, Dylen Mcelhannon Boschen 05/31/2014, 8:58 AM

## 2014-09-13 ENCOUNTER — Emergency Department (HOSPITAL_COMMUNITY)
Admission: EM | Admit: 2014-09-13 | Discharge: 2014-09-13 | Disposition: A | Discharge status: Home / Self Care | Payer: Medicaid Other | Attending: Emergency Medicine | Admitting: Emergency Medicine

## 2014-09-13 ENCOUNTER — Encounter (HOSPITAL_COMMUNITY): Payer: Self-pay | Admitting: Family Medicine

## 2014-09-13 DIAGNOSIS — Z87891 Personal history of nicotine dependence: Secondary | ICD-10-CM | POA: Diagnosis not present

## 2014-09-13 DIAGNOSIS — Z3202 Encounter for pregnancy test, result negative: Secondary | ICD-10-CM | POA: Insufficient documentation

## 2014-09-13 DIAGNOSIS — M549 Dorsalgia, unspecified: Secondary | ICD-10-CM | POA: Diagnosis not present

## 2014-09-13 DIAGNOSIS — Z8619 Personal history of other infectious and parasitic diseases: Secondary | ICD-10-CM | POA: Diagnosis not present

## 2014-09-13 DIAGNOSIS — B9689 Other specified bacterial agents as the cause of diseases classified elsewhere: Secondary | ICD-10-CM

## 2014-09-13 DIAGNOSIS — N390 Urinary tract infection, site not specified: Secondary | ICD-10-CM

## 2014-09-13 DIAGNOSIS — Z862 Personal history of diseases of the blood and blood-forming organs and certain disorders involving the immune mechanism: Secondary | ICD-10-CM | POA: Diagnosis not present

## 2014-09-13 DIAGNOSIS — N760 Acute vaginitis: Secondary | ICD-10-CM | POA: Diagnosis not present

## 2014-09-13 DIAGNOSIS — R1110 Vomiting, unspecified: Secondary | ICD-10-CM | POA: Diagnosis present

## 2014-09-13 LAB — URINALYSIS, ROUTINE W REFLEX MICROSCOPIC
Glucose, UA: NEGATIVE mg/dL
Hgb urine dipstick: NEGATIVE
Ketones, ur: 15 mg/dL — AB
NITRITE: NEGATIVE
PH: 6 (ref 5.0–8.0)
Protein, ur: NEGATIVE mg/dL
SPECIFIC GRAVITY, URINE: 1.028 (ref 1.005–1.030)
Urobilinogen, UA: 1 mg/dL (ref 0.0–1.0)

## 2014-09-13 LAB — COMPREHENSIVE METABOLIC PANEL
ALT: 21 U/L (ref 14–54)
ANION GAP: 8 (ref 5–15)
AST: 23 U/L (ref 15–41)
Albumin: 3.6 g/dL (ref 3.5–5.0)
Alkaline Phosphatase: 52 U/L (ref 38–126)
BILIRUBIN TOTAL: 0.8 mg/dL (ref 0.3–1.2)
BUN: 6 mg/dL (ref 6–20)
CALCIUM: 8.6 mg/dL — AB (ref 8.9–10.3)
CHLORIDE: 101 mmol/L (ref 101–111)
CO2: 28 mmol/L (ref 22–32)
CREATININE: 0.99 mg/dL (ref 0.44–1.00)
GFR calc Af Amer: >60 mL/min (ref >60)
Glucose, Bld: 90 mg/dL (ref 65–99)
Potassium: 3.6 mmol/L (ref 3.5–5.1)
Sodium: 137 mmol/L (ref 135–145)
Total Protein: 6.5 g/dL (ref 6.5–8.1)

## 2014-09-13 LAB — CBC
HCT: 38.3 % (ref 36.0–46.0)
HEMOGLOBIN: 12.3 g/dL (ref 12.0–15.0)
MCH: 27.9 pg (ref 26.0–34.0)
MCHC: 32.1 g/dL (ref 30.0–36.0)
MCV: 86.8 fL (ref 78.0–100.0)
Platelets: 126 10*3/uL — ABNORMAL LOW (ref 150–400)
RBC: 4.41 MIL/uL (ref 3.87–5.11)
RDW: 14.8 % (ref 11.5–15.5)
WBC: 6.3 10*3/uL (ref 4.0–10.5)

## 2014-09-13 LAB — URINE MICROSCOPIC-ADD ON

## 2014-09-13 LAB — WET PREP, GENITAL
Trich, Wet Prep: NONE SEEN
Yeast Wet Prep HPF POC: NONE SEEN

## 2014-09-13 LAB — POC URINE PREG, ED: Preg Test, Ur: NEGATIVE

## 2014-09-13 MED ORDER — METRONIDAZOLE 500 MG PO TABS: 500.0000 mg | ORAL_TABLET | Freq: Two times a day (BID) | ORAL | Status: DC

## 2014-09-13 MED ORDER — CEPHALEXIN 500 MG PO CAPS: 500.0000 mg | ORAL_CAPSULE | Freq: Four times a day (QID) | ORAL | Status: DC

## 2014-09-13 MED ORDER — HYDROCODONE-ACETAMINOPHEN 5-325 MG PO TABS: 1.0000 | ORAL_TABLET | Freq: Four times a day (QID) | ORAL | Status: DC | PRN

## 2014-09-13 MED ORDER — DEXTROSE 5 % IV SOLN
1.0000 g | Freq: Once | INTRAVENOUS | Status: DC
  Filled 2014-09-13: qty 10

## 2014-09-13 MED ORDER — HYDROCODONE-ACETAMINOPHEN 5-325 MG PO TABS
1.0000 | ORAL_TABLET | Freq: Once | ORAL | Status: AC
  Administered 2014-09-13: 1 via ORAL
  Filled 2014-09-13: qty 1

## 2014-09-13 MED ORDER — CEFTRIAXONE SODIUM 1 G IJ SOLR
1.0000 g | Freq: Once | INTRAMUSCULAR | Status: AC
  Administered 2014-09-13: 1 g via INTRAMUSCULAR
  Filled 2014-09-13: qty 10

## 2014-09-13 MED ORDER — LIDOCAINE HCL (PF) 1 % IJ SOLN
INTRAMUSCULAR | Status: AC
  Administered 2014-09-13: 5 mL
  Filled 2014-09-13: qty 5

## 2014-09-13 MED ORDER — IBUPROFEN 200 MG PO TABS
400.0000 mg | ORAL_TABLET | Freq: Once | ORAL | Status: AC
  Administered 2014-09-13: 400 mg via ORAL
  Filled 2014-09-13: qty 2

## 2014-09-13 NOTE — Discharge Instructions (Signed)
It was our pleasure to provide your ER care today - we hope that you feel better.  The lab tests show a probable urine infection - take antibiotic (keflex) as prescribed.  For vaginal discharge/clue cells, also take flagyl as prescribed - make sure not to drink any alcohol when taking this medication.  Take motrin or aleve as need for pain. You may also take hydrocodone as need for pain. No driving when taking hydrocodone. Also, do not take tylenol or acetaminophen containing medication when taking hydrocodone.  Follow up with primary care doctor/urgent care in the next few days if symptoms fail to improve/resolve.  Return to ER if worse, new symptoms, high fevers, persistent vomiting, weak/faint, severe abdominal pain, other concern.  You were given pain medication in the ER - no driving for the next 4 hours.      Urinary Tract Infection Urinary tract infections (UTIs) can develop anywhere along your urinary tract. Your urinary tract is your body's drainage system for removing wastes and extra water. Your urinary tract includes two kidneys, two ureters, a bladder, and a urethra. Your kidneys are a pair of bean-shaped organs. Each kidney is about the size of your fist. They are located below your ribs, one on each side of your spine. CAUSES Infections are caused by microbes, which are microscopic organisms, including fungi, viruses, and bacteria. These organisms are so small that they can only be seen through a microscope. Bacteria are the microbes that most commonly cause UTIs. SYMPTOMS  Symptoms of UTIs may vary by age and gender of the patient and by the location of the infection. Symptoms in young women typically include a frequent and intense urge to urinate and a painful, burning feeling in the bladder or urethra during urination. Older women and men are more likely to be tired, shaky, and weak and have muscle aches and abdominal pain. A fever may mean the infection is in your kidneys.  Other symptoms of a kidney infection include pain in your back or sides below the ribs, nausea, and vomiting. DIAGNOSIS To diagnose a UTI, your caregiver will ask you about your symptoms. Your caregiver also will ask to provide a urine sample. The urine sample will be tested for bacteria and white blood cells. White blood cells are made by your body to help fight infection. TREATMENT  Typically, UTIs can be treated with medication. Because most UTIs are caused by a bacterial infection, they usually can be treated with the use of antibiotics. The choice of antibiotic and length of treatment depend on your symptoms and the type of bacteria causing your infection. HOME CARE INSTRUCTIONS  If you were prescribed antibiotics, take them exactly as your caregiver instructs you. Finish the medication even if you feel better after you have only taken some of the medication.  Drink enough water and fluids to keep your urine clear or pale yellow.  Avoid caffeine, tea, and carbonated beverages. They tend to irritate your bladder.  Empty your bladder often. Avoid holding urine for long periods of time.  Empty your bladder before and after sexual intercourse.  After a bowel movement, women should cleanse from front to back. Use each tissue only once. SEEK MEDICAL CARE IF:   You have back pain.  You develop a fever.  Your symptoms do not begin to resolve within 3 days. SEEK IMMEDIATE MEDICAL CARE IF:   You have severe back pain or lower abdominal pain.  You develop chills.  You have nausea or vomiting.  You have continued burning or discomfort with urination. MAKE SURE YOU:   Understand these instructions.  Will watch your condition.  Will get help right away if you are not doing well or get worse. Document Released: 12/11/2004 Document Revised: 09/02/2011 Document Reviewed: 04/11/2011 [facility] Patient Information 2015 [place], [place]. This information is not intended to replace advice  given to you by your health care provider. Make sure you discuss any questions you have with your health care provider.     Bacterial Vaginosis Bacterial vaginosis is a vaginal infection that occurs when the normal balance of bacteria in the vagina is disrupted. It results from an overgrowth of certain bacteria. This is the most common vaginal infection in women of childbearing age. Treatment is important to prevent complications, especially in pregnant women, as it can cause a premature delivery. CAUSES  Bacterial vaginosis is caused by an increase in harmful bacteria that are normally present in smaller amounts in the vagina. Several different kinds of bacteria can cause bacterial vaginosis. However, the reason that the condition develops is not fully understood. RISK FACTORS Certain activities or behaviors can put you at an increased risk of developing bacterial vaginosis, including:  Having a new sex partner or multiple sex partners.  Douching.  Using an intrauterine device (IUD) for contraception. Women do not get bacterial vaginosis from toilet seats, bedding, swimming pools, or contact with objects around them. SIGNS AND SYMPTOMS  Some women with bacterial vaginosis have no signs or symptoms. Common symptoms include:  Grey vaginal discharge.  A fishlike odor with discharge, especially after sexual intercourse.  Itching or burning of the vagina and vulva.  Burning or pain with urination. DIAGNOSIS  Your health care provider will take a medical history and examine the vagina for signs of bacterial vaginosis. A sample of vaginal fluid may be taken. Your health care provider will look at this sample under a microscope to check for bacteria and abnormal cells. A vaginal pH test may also be done.  TREATMENT  Bacterial vaginosis may be treated with antibiotic medicines. These may be given in the form of a pill or a vaginal cream. A second round of antibiotics may be prescribed if the  condition comes back after treatment.  HOME CARE INSTRUCTIONS   Only take over-the-counter or prescription medicines as directed by your health care provider.  If antibiotic medicine was prescribed, take it as directed. Make sure you finish it even if you start to feel better.  Do not have sex until treatment is completed.  Tell all sexual partners that you have a vaginal infection. They should see their health care provider and be treated if they have problems, such as a mild rash or itching.  Practice safe sex by using condoms and only having one sex partner. SEEK MEDICAL CARE IF:   Your symptoms are not improving after 3 days of treatment.  You have increased discharge or pain.  You have a fever. MAKE SURE YOU:   Understand these instructions.  Will watch your condition.  Will get help right away if you are not doing well or get worse. FOR MORE INFORMATION  Centers for Disease Control and Prevention, Division of STD Prevention: SolutionApps.co.za American Sexual Health Association (ASHA): www.ashastd.org  Document Released: 03/03/2005 Document Revised: 12/22/2012 Document Reviewed: 10/13/2012 [facility] Patient Information 2015 [place], [place]. This information is not intended to replace advice given to you by your health care provider. Make sure you discuss any questions you have with your health care provider.

## 2014-09-13 NOTE — ED Notes (Signed)
[facility]

## 2014-09-13 NOTE — ED Provider Notes (Addendum)
CSN: [id number]     Arrival date & time 09/13/14  1134 History   First MD Initiated Contact with Patient 09/13/14 1213     Chief Complaint  Patient presents with  . Back Pain  . Emesis  . Chills     (Consider location/radiation/quality/duration/timing/severity/associated sxs/prior Treatment) Patient is a 34 y.o. female presenting with back pain and vomiting. The history is provided by the patient.  Back Pain Associated symptoms: no abdominal pain, no chest pain, no dysuria, no fever and no headaches   Emesis Associated symptoms: chills   Associated symptoms: no abdominal pain, no diarrhea, no headaches and no sore throat   Patient aprox 3-4 months post partum s/p nsvd, presents c/o low back pain ? worse on right, chills, and episode nv.  Emesis clear, not bloody or bilious. No abd distension. Having normal bms. No known bad food ingestion or ill contacts. lnmp 2 weeks ago. No unusual vaginal discharge or bleeding. No unilateral flank pain. No dysuria or gu c/o. Low back pain, dull, mild, non radiating. No numbness/weakness.     Past Medical History  Diagnosis Date  . Preterm labor   . Anemia   . Skin infection    Past Surgical History  Procedure Laterality Date  . Dilation and curettage of uterus     History reviewed. No pertinent family history. History  Substance Use Topics  . Smoking status: Former Smoker -- 0.25 packs/day    Types: Cigarettes    Quit date: 01/16/2014  . Smokeless tobacco: Never Used  . Alcohol Use: No   OB History    Gravida Para Term Preterm AB TAB SAB Ectopic Multiple Living   0 1 6     Review of Systems  Constitutional: Positive for chills. Negative for fever.  HENT: Negative for congestion and sore throat.   Eyes: Negative for redness.  Respiratory: Negative for cough and shortness of breath.   Cardiovascular: Negative for chest pain.  Gastrointestinal: Positive for vomiting. Negative for abdominal pain, diarrhea and  constipation.  Endocrine: Negative for polyuria.  Genitourinary: Negative for dysuria, flank pain, vaginal bleeding and vaginal discharge.  Musculoskeletal: Positive for back pain. Negative for neck pain.  Skin: Negative for rash.  Neurological: Negative for headaches.  Hematological: Does not bruise/bleed easily.  Psychiatric/Behavioral: Negative for confusion.      Allergies  Review of patient's allergies indicates no known allergies.  Home Medications   Prior to Admission medications   Not on File   BP 139/77 mmHg  Pulse 97  Temp(Src) 99 F (37.2 C) (Oral)  Resp 18  SpO2 100%  LMP 08/28/2014 Physical Exam  Constitutional: She appears well-developed and well-nourished. No distress.  HENT:  Mouth/Throat: Oropharynx is clear and moist.  Eyes: Conjunctivae are normal. No scleral icterus.  Neck: Neck supple. No tracheal deviation present.  No stiffness or rigidity  Cardiovascular: Normal rate, regular rhythm, normal heart sounds and intact distal pulses.   Pulmonary/Chest: Effort normal and breath sounds normal. No respiratory distress.  Abdominal: Soft. Normal appearance and bowel sounds are normal. She exhibits no distension and no mass. There is no tenderness. There is no rebound and no guarding.  Genitourinary:  ?mild right cva tenderness. Ext exam normal. Mod whitish vaginal dis, mild malodor, cervix closed. No cmt. No adx masses or tenderness.   Musculoskeletal: She exhibits no edema.  tls spine non tender.   Neurological: She is alert.  Skin: Skin is warm and dry.  No rash noted. She is not diaphoretic.  Psychiatric: She has a normal mood and affect.  Nursing note and vitals reviewed.   ED Course  Procedures (including critical care time) Labs Review   Results for orders placed or performed during the hospital encounter of 09/13/14  Wet prep, genital  Result Value Ref Range   Yeast Wet Prep HPF POC NONE SEEN NONE SEEN   Trich, Wet Prep NONE SEEN NONE SEEN    Clue Cells Wet Prep HPF POC MODERATE (A) NONE SEEN   WBC, Wet Prep HPF POC TOO NUMEROUS TO COUNT (A) NONE SEEN  Urinalysis, Routine w reflex microscopic (not at [facility])  Result Value Ref Range   Color, Urine AMBER (A) YELLOW   APPearance HAZY (A) CLEAR   Specific Gravity, Urine 1.028 1.005 - 1.030   pH 6.0 5.0 - 8.0   Glucose, UA NEGATIVE NEGATIVE mg/dL   Hgb urine dipstick NEGATIVE NEGATIVE   Bilirubin Urine SMALL (A) NEGATIVE   Ketones, ur 15 (A) NEGATIVE mg/dL   Protein, ur NEGATIVE NEGATIVE mg/dL   Urobilinogen, UA 1.0 0.0 - 1.0 mg/dL   Nitrite NEGATIVE NEGATIVE   Leukocytes, UA MODERATE (A) NEGATIVE  CBC  Result Value Ref Range   WBC 6.3 4.0 - 10.5 K/uL   RBC 4.41 3.87 - 5.11 MIL/uL   Hemoglobin 12.3 12.0 - 15.0 g/dL   HCT 16.1 09.6 - 04.5 %   MCV 86.8 78.0 - 100.0 fL   MCH 27.9 26.0 - 34.0 pg   MCHC 32.1 30.0 - 36.0 g/dL   RDW 40.9 81.1 - 91.4 %   Platelets 126 (L) 150 - 400 K/uL  Comprehensive metabolic panel  Result Value Ref Range   Sodium 137 135 - 145 mmol/L   Potassium 3.6 3.5 - 5.1 mmol/L   Chloride 101 101 - 111 mmol/L   CO2 28 22 - 32 mmol/L   Glucose, Bld 90 65 - 99 mg/dL   BUN 6 6 - 20 mg/dL   Creatinine, Ser 7.82 0.44 - 1.00 mg/dL   Calcium 8.6 (L) 8.9 - 10.3 mg/dL   Total Protein 6.5 6.5 - 8.1 g/dL   Albumin 3.6 3.5 - 5.0 g/dL   AST 23 15 - 41 U/L   ALT 21 14 - 54 U/L   Alkaline Phosphatase 52 38 - 126 U/L   Total Bilirubin 0.8 0.3 - 1.2 mg/dL   GFR calc non Af Amer >60 >60 mL/min   GFR calc Af Amer >60 >60 mL/min   Anion gap 8 5 - 15  Urine microscopic-add on  Result Value Ref Range   Squamous Epithelial / LPF MANY (A) RARE   WBC, UA 11-20 <3 WBC/hpf   RBC / HPF 0-2 <3 RBC/hpf   Bacteria, UA FEW (A) RARE   Urine-Other MUCOUS PRESENT   POC Urine Pregnancy, ED  (If Pre-menopausal female)  not at [facility]  Result Value Ref Range   Preg Test, Ur NEGATIVE NEGATIVE       MDM   Labs. Vitals bp normal 139/77. Hr 90's, t 99.   Reviewed nursing  notes and prior charts for additional history.   ua w LE pos, 11-20 wbc and bact - will cx and rx.  Checked w pt - pt is not breastfeeding her baby.  Confirmed nkda. Rocephin iv.   On wet prep, multiple clue cells, abundant d/c, and mild malodor.  No cmt or adx masses/tenderness. Will rx for bv.  Recheck abd soft nt.  Pt  tolerating po fluids. Wbc normal. Afeb.   Pt currently appears stable for d/c.  Return precautions provided.       Cathren LaineKevin Jules Vidovich, MD 09/13/14 [phone]

## 2014-09-13 NOTE — ED Notes (Signed)
Pt here for back pain, chills, vomiting. sts that she recently had a foul odor.

## 2014-09-14 LAB — GC/CHLAMYDIA PROBE AMP (~~LOC~~) NOT AT ARMC
Chlamydia: NEGATIVE
NEISSERIA GONORRHEA: NEGATIVE

## 2014-09-15 LAB — URINE CULTURE

## 2015-10-04 ENCOUNTER — Ambulatory Visit (HOSPITAL_COMMUNITY)
Admission: EM | Admit: 2015-10-04 | Discharge: 2015-10-04 | Disposition: A | Discharge status: Home / Self Care | Payer: Medicaid Other | Attending: Emergency Medicine | Admitting: Emergency Medicine

## 2015-10-04 ENCOUNTER — Encounter (HOSPITAL_COMMUNITY): Payer: Self-pay | Admitting: Family Medicine

## 2015-10-04 DIAGNOSIS — Z79899 Other long term (current) drug therapy: Secondary | ICD-10-CM | POA: Insufficient documentation

## 2015-10-04 DIAGNOSIS — N760 Acute vaginitis: Secondary | ICD-10-CM | POA: Insufficient documentation

## 2015-10-04 DIAGNOSIS — R21 Rash and other nonspecific skin eruption: Secondary | ICD-10-CM | POA: Insufficient documentation

## 2015-10-04 DIAGNOSIS — B9689 Other specified bacterial agents as the cause of diseases classified elsewhere: Secondary | ICD-10-CM

## 2015-10-04 DIAGNOSIS — Z87891 Personal history of nicotine dependence: Secondary | ICD-10-CM | POA: Insufficient documentation

## 2015-10-04 DIAGNOSIS — L259 Unspecified contact dermatitis, unspecified cause: Secondary | ICD-10-CM | POA: Insufficient documentation

## 2015-10-04 DIAGNOSIS — A499 Bacterial infection, unspecified: Secondary | ICD-10-CM

## 2015-10-04 LAB — POCT URINALYSIS DIP (DEVICE)
Bilirubin Urine: NEGATIVE
Glucose, UA: NEGATIVE mg/dL
HGB URINE DIPSTICK: NEGATIVE
KETONES UR: NEGATIVE mg/dL
Nitrite: NEGATIVE
PH: 7 (ref 5.0–8.0)
PROTEIN: NEGATIVE mg/dL
SPECIFIC GRAVITY, URINE: 1.02 (ref 1.005–1.030)
Urobilinogen, UA: 0.2 mg/dL (ref 0.0–1.0)

## 2015-10-04 LAB — POCT PREGNANCY, URINE: Preg Test, Ur: NEGATIVE

## 2015-10-04 MED ORDER — PREDNISONE 20 MG PO TABS: ORAL_TABLET | ORAL | Status: DC

## 2015-10-04 MED ORDER — METRONIDAZOLE 500 MG PO TABS: 500.0000 mg | ORAL_TABLET | Freq: Two times a day (BID) | ORAL | Status: DC

## 2015-10-04 NOTE — Discharge Instructions (Signed)
Bacterial Vaginosis Bacterial vaginosis is a vaginal infection that occurs when the normal balance of bacteria in the vagina is disrupted. It results from an overgrowth of certain bacteria. This is the most common vaginal infection in women of childbearing age. Treatment is important to prevent complications, especially in pregnant women, as it can cause a premature delivery. CAUSES  Bacterial vaginosis is caused by an increase in harmful bacteria that are normally present in smaller amounts in the vagina. Several different kinds of bacteria can cause bacterial vaginosis. However, the reason that the condition develops is not fully understood. RISK FACTORS Certain activities or behaviors can put you at an increased risk of developing bacterial vaginosis, including:  Having a new sex partner or multiple sex partners.  Douching.  Using an intrauterine device (IUD) for contraception. Women do not get bacterial vaginosis from toilet seats, bedding, swimming pools, or contact with objects around them. SIGNS AND SYMPTOMS  Some women with bacterial vaginosis have no signs or symptoms. Common symptoms include:  Grey vaginal discharge.  A fishlike odor with discharge, especially after sexual intercourse.  Itching or burning of the vagina and vulva.  Burning or pain with urination. DIAGNOSIS  Your health care provider will take a medical history and examine the vagina for signs of bacterial vaginosis. A sample of vaginal fluid may be taken. Your health care provider will look at this sample under a microscope to check for bacteria and abnormal cells. A vaginal pH test may also be done.  TREATMENT  Bacterial vaginosis may be treated with antibiotic medicines. These may be given in the form of a pill or a vaginal cream. A second round of antibiotics may be prescribed if the condition comes back after treatment. Because bacterial vaginosis increases your risk for sexually transmitted diseases, getting  treated can help reduce your risk for chlamydia, gonorrhea, HIV, and herpes. HOME CARE INSTRUCTIONS   Only take over-the-counter or prescription medicines as directed by your health care provider.  If antibiotic medicine was prescribed, take it as directed. Make sure you finish it even if you start to feel better.  Tell all sexual partners that you have a vaginal infection. They should see their health care provider and be treated if they have problems, such as a mild rash or itching.  During treatment, it is important that you follow these instructions:  Avoid sexual activity or use condoms correctly.  Do not douche.  Avoid alcohol as directed by your health care provider.  Avoid breastfeeding as directed by your health care provider. SEEK MEDICAL CARE IF:   Your symptoms are not improving after 3 days of treatment.  You have increased discharge or pain.  You have a fever. MAKE SURE YOU:   Understand these instructions.  Will watch your condition.  Will get help right away if you are not doing well or get worse. FOR MORE INFORMATION  Centers for Disease Control and Prevention, Division of STD Prevention: SolutionApps.co.za American Sexual Health Association (ASHA): www.ashastd.org    This information is not intended to replace advice given to you by your health care provider. Make sure you discuss any questions you have with your health care provider.   Document Released: 03/03/2005 Document Revised: 03/24/2014 Document Reviewed: 10/13/2012 Elsevier Interactive Patient Education 2016 [employer] Inc.  Contact Dermatitis Take the prednisone as directed for the rash. Take it with food. You may apply Benadryl cream or gel to the rash to assist with itching. Also take an antihistamine such as Benadryl. This  may cause drowsiness. You may also take a nondrowsy type antihistamine such as Zyrtec or Claritin to help with itching. Dermatitis is redness, soreness, and swelling  (inflammation) of the skin. Contact dermatitis is a reaction to certain substances that touch the skin. You either touched something that irritated your skin, or you have allergies to something you touched.  HOME CARE  Skin Care  Moisturize your skin as needed.  Apply cool compresses to the affected areas.   Try taking a bath with:   Epsom salts. Follow the instructions on the package. You can get these at a pharmacy or grocery store.   Baking soda. Pour a small amount into the bath as told by your doctor.   Colloidal oatmeal. Follow the instructions on the package. You can get this at a pharmacy or grocery store.   Try applying baking soda paste to your skin. Stir water into baking soda until it looks like paste.  Do not scratch your skin.   Bathe less often.  Bathe in lukewarm water. Avoid using hot water.  Medicines  Take or apply over-the-counter and prescription medicines only as told by your doctor.   If you were prescribed an antibiotic medicine, take or apply your antibiotic as told by your doctor. Do not stop taking the antibiotic even if your condition starts to get better. General Instructions  Keep all follow-up visits as told by your doctor. This is important.   Avoid the substance that caused your reaction. If you do not know what caused it, keep a journal to try to track what caused it. Write down:   What you eat.   What cosmetic products you use.   What you drink.   What you wear in the affected area. This includes jewelry.   If you were given a bandage (dressing), take care of it as told by your doctor. This includes when to change and remove it.  GET HELP IF:   You do not get better with treatment.   Your condition gets worse.   You have signs of infection such as:  Swelling.  Tenderness.  Redness.  Soreness.  Warmth.   You have a fever.   You have new symptoms.  GET HELP RIGHT AWAY IF:   You have a very bad  headache.  You have neck pain.  Your neck is stiff.   You throw up (vomit).   You feel very sleepy.   You see red streaks coming from the affected area.   Your bone or joint underneath the affected area becomes painful after the skin has healed.   The affected area turns darker.   You have trouble breathing.    This information is not intended to replace advice given to you by your health care provider. Make sure you discuss any questions you have with your health care provider.   Document Released: 12/29/2008 Document Revised: 11/22/2014 Document Reviewed: 07/19/2014 Elsevier Interactive Patient Education 2016 [employer].  Vaginitis Vaginitis is an inflammation of the vagina. It can happen when the normal bacteria and yeast in the vagina grow too much. There are different types. Treatment will depend on the type you have. HOME CARE  Take all medicines as told by your doctor.  Keep your vagina area clean and dry. Avoid soap. Rinse the area with water.  Avoid washing and cleaning out the vagina (douching).  Do not use tampons or have sex (intercourse) until your treatment is done.  Wipe from front to back after  going to the restroom.  Wear cotton underwear.  Avoid wearing underwear while you sleep until your vaginitis is gone.  Avoid tight pants. Avoid underwear or nylons without a cotton panel.  Take off wet clothing (such as a bathing suit) as soon as you can.  Use mild, unscented products. Avoid fabric softeners and scented:  Feminine sprays.  Laundry detergents.  Tampons.  Soaps or bubble baths.  Practice safe sex and use condoms. GET HELP RIGHT AWAY IF:   You have belly (abdominal) pain.  You have a fever or lasting symptoms for more than 2-3 days.  You have a fever and your symptoms suddenly get worse. MAKE SURE YOU:   Understand these instructions.  Will watch this condition.  Will get help right away if you are not doing well or  get worse.   This information is not intended to replace advice given to you by your health care provider. Make sure you discuss any questions you have with your health care provider.   Document Released: 05/30/2008 Document Revised: 11/26/2011 Document Reviewed: 08/14/2011 Elsevier Interactive Patient Education [employer] Elsevier Inc.

## 2015-10-04 NOTE — ED Notes (Signed)
Pt here for blister like rash to arms and stomach/flank area. sts also abnormal discharge from vagina x 4 days.

## 2015-10-04 NOTE — ED Provider Notes (Signed)
CSN: [id number]     Arrival date & time 10/04/15  1259 History   First MD Initiated Contact with Patient 10/04/15 1318     Chief Complaint  Patient presents with  . Rash  . Vaginal Discharge   (Consider location/radiation/quality/duration/timing/severity/associated sxs/prior Treatment) HPI Comments: 69 female complaining of itchy papular vesicular lesions over a red base located to the left anterior abdomen and lower chest, arms and hands. This started 4-5 days ago. Started out as small papules and vesicles bread to other areas as mentioned above. The abdomen has a block of lesions coalescing together. The lesions are very pruritic. No pain or tenderness.  Second complaint is that of vaginal discharge for a week. Also having some intermittent abdominal pain and urinary frequency. Denies dysuria.   She states the last time she had a rash as above she was treated with triamcinolone but had a severe skin allergic reaction that caused severe pain, ended up in the emergency department receiving Percocet and morphine for the pain.  The chart regarding the above reaction was reviewed. The provider seemed to think that the left lower leg had developed folliculitis and cellulitis. The patient had continued to apply the triamcinolone cream to the infected leg and this produced worsening of cellulitis and including several bullae. This may be the reason the patient believes that the triamcinolone cream produces a bad skin reaction or allergy. She has been using Cortaid 1% without an allergic reaction. She states it does not help much with the rash.   Past Medical History  Diagnosis Date  . Preterm labor   . Anemia   . Skin infection    Past Surgical History  Procedure Laterality Date  . Dilation and curettage of uterus     History reviewed. No pertinent family history. Social History  Substance Use Topics  . Smoking status: Former Smoker -- 0.25 packs/day    Types: Cigarettes    Quit date:  01/16/2014  . Smokeless tobacco: Never Used  . Alcohol Use: No   OB History    Gravida Para Term Preterm AB TAB SAB Ectopic Multiple Living   0 1 6     Review of Systems  Constitutional: Negative.   HENT: Negative.   Respiratory: Negative.   Cardiovascular: Negative.   Genitourinary: Positive for frequency, vaginal discharge and pelvic pain. Negative for dysuria and menstrual problem.  Skin: Positive for rash.  Neurological: Negative.   All other systems reviewed and are negative.   Allergies  Review of patient's allergies indicates no known allergies.  Home Medications   Prior to Admission medications   Medication Sig Start Date End Date Taking? Authorizing Provider  cephALEXin (KEFLEX) 500 MG capsule Take 1 capsule (500 mg total) by mouth 4 (four) times daily. 09/13/14   Cathren Laine, MD  HYDROcodone-acetaminophen (NORCO/VICODIN) 5-325 MG per tablet Take 1-2 tablets by mouth every 6 (six) hours as needed for moderate pain. 09/13/14   Cathren Laine, MD  metroNIDAZOLE (FLAGYL) 500 MG tablet Take 1 tablet (500 mg total) by mouth 2 (two) times daily. X 7 days 10/04/15   Hayden Rasmussen, NP  predniSONE (DELTASONE) 20 MG tablet Take 3 tabs po on first day, 2 tabs second day, 2 tabs third day, 1 tab fourth day, 1 tab 5th day. Take with food. 10/04/15   Hayden Rasmussen, NP   Meds Ordered and Administered this Visit  Medications - No data to display  BP 135/75 mmHg  Pulse  71  Temp(Src) 98.6 F (37 C)  Resp 18  SpO2 94% No data found.   Physical Exam  Constitutional: She is oriented to person, place, and time. She appears well-developed and well-nourished. No distress.  Eyes: EOM are normal.  Neck: Normal range of motion. Neck supple.  Cardiovascular: Normal rate.   Pulmonary/Chest: Effort normal. No respiratory distress.  Genitourinary:  Normal external female genitalia. Cervix is right of midline. There is a small amount of malodorous thin gray to white discharge. No  cervical tenderness. No adnexal tenderness. No other lesions seen.  Musculoskeletal: She exhibits no edema.  Neurological: She is alert and oriented to person, place, and time. She exhibits normal muscle tone.  Skin: Skin is warm and dry.  The left abdomen starting at the midline as a roughly rectangular area of densely coalesced lesions of erythematous papulovesicular description. There are isolated papules and vesicles to the upper extremities. No bleeding or purulence noted.  Psychiatric: She has a normal mood and affect.  Nursing note and vitals reviewed.   ED Course  Procedures (including critical care time)  Labs Review Labs Reviewed  POCT URINALYSIS DIP (DEVICE) - Abnormal; Notable for the following:    Leukocytes, UA TRACE (*)    All other components within normal limits  POCT PREGNANCY, URINE  CERVICOVAGINAL ANCILLARY ONLY   Results for orders placed or performed during the hospital encounter of 10/04/15  POCT urinalysis dip (device)  Result Value Ref Range   Glucose, UA NEGATIVE NEGATIVE mg/dL   Bilirubin Urine NEGATIVE NEGATIVE   Ketones, ur NEGATIVE NEGATIVE mg/dL   Specific Gravity, Urine 1.020 1.005 - 1.030   Hgb urine dipstick NEGATIVE NEGATIVE   pH 7.0 5.0 - 8.0   Protein, ur NEGATIVE NEGATIVE mg/dL   Urobilinogen, UA 0.2 0.0 - 1.0 mg/dL   Nitrite NEGATIVE NEGATIVE   Leukocytes, UA TRACE (A) NEGATIVE  Pregnancy, urine POC  Result Value Ref Range   Preg Test, Ur NEGATIVE NEGATIVE     Imaging Review No results found.   Visual Acuity Review  Right Eye Distance:   Left Eye Distance:   Bilateral Distance:    Right Eye Near:   Left Eye Near:    Bilateral Near:         MDM   1. Contact dermatitis   2. BV (bacterial vaginosis)   3. Vaginitis   4. Rash    Take the prednisone as directed for the rash. Take it with food. You may apply Benadryl cream or gel to the rash to assist with itching. Also take an antihistamine such as Benadryl. This may  cause drowsiness. You may also take a nondrowsy type antihistamine such as Zyrtec or Claritin to help with itching. Meds ordered this encounter  Medications  . predniSONE (DELTASONE) 20 MG tablet    Sig: Take 3 tabs po on first day, 2 tabs second day, 2 tabs third day, 1 tab fourth day, 1 tab 5th day. Take with food.    Dispense:  9 tablet    Refill:  0    Order Specific Question:  Supervising Provider    Answer:  Charm RingsHONIG, ERIN J [id number]]  . metroNIDAZOLE (FLAGYL) 500 MG tablet    Sig: Take 1 tablet (500 mg total) by mouth 2 (two) times daily. X 7 days    Dispense:  14 tablet    Refill:  0    Order Specific Question:  Supervising Provider    Answer:  Charm RingsHONIG, ERIN J [phone][[id number]]  Hayden Rasmussen, NP 10/04/15 1442

## 2015-10-05 LAB — CERVICOVAGINAL ANCILLARY ONLY
Chlamydia: NEGATIVE
NEISSERIA GONORRHEA: NEGATIVE
Wet Prep (BD Affirm): POSITIVE — AB

## 2015-12-11 ENCOUNTER — Encounter: Payer: Self-pay | Admitting: *Deleted

## 2018-06-26 ENCOUNTER — Encounter (HOSPITAL_COMMUNITY): Payer: Self-pay | Admitting: Emergency Medicine

## 2018-06-26 ENCOUNTER — Other Ambulatory Visit: Payer: Self-pay

## 2018-06-26 ENCOUNTER — Emergency Department (HOSPITAL_COMMUNITY)
Admission: EM | Admit: 2018-06-26 | Discharge: 2018-06-26 | Disposition: A | Discharge status: Home / Self Care | Payer: Self-pay | Attending: Emergency Medicine | Admitting: Emergency Medicine

## 2018-06-26 DIAGNOSIS — Y929 Unspecified place or not applicable: Secondary | ICD-10-CM | POA: Insufficient documentation

## 2018-06-26 DIAGNOSIS — Y939 Activity, unspecified: Secondary | ICD-10-CM | POA: Insufficient documentation

## 2018-06-26 DIAGNOSIS — Z79899 Other long term (current) drug therapy: Secondary | ICD-10-CM | POA: Insufficient documentation

## 2018-06-26 DIAGNOSIS — F17210 Nicotine dependence, cigarettes, uncomplicated: Secondary | ICD-10-CM | POA: Insufficient documentation

## 2018-06-26 DIAGNOSIS — Y999 Unspecified external cause status: Secondary | ICD-10-CM | POA: Insufficient documentation

## 2018-06-26 DIAGNOSIS — Z23 Encounter for immunization: Secondary | ICD-10-CM | POA: Insufficient documentation

## 2018-06-26 DIAGNOSIS — S01511A Laceration without foreign body of lip, initial encounter: Secondary | ICD-10-CM | POA: Insufficient documentation

## 2018-06-26 MED ORDER — LIDOCAINE HCL (PF) 1 % IJ SOLN
10.0000 mL | Freq: Once | INTRAMUSCULAR | Status: AC
  Administered 2018-06-26: 10 mL
  Filled 2018-06-26: qty 10

## 2018-06-26 MED ORDER — CHLORHEXIDINE GLUCONATE 0.12 % MT SOLN: 15.0000 mL | Freq: Two times a day (BID) | OROMUCOSAL | 0 refills | Status: DC

## 2018-06-26 MED ORDER — TETANUS-DIPHTH-ACELL PERTUSSIS 5-2.5-18.5 LF-MCG/0.5 IM SUSP
0.5000 mL | Freq: Once | INTRAMUSCULAR | Status: AC
  Administered 2018-06-26: 12:00:00 0.5 mL via INTRAMUSCULAR
  Filled 2018-06-26: qty 0.5
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
## 2018-06-26 NOTE — ED Notes (Signed)
Patient verbalizes understanding of discharge instructions . Opportunity for questions and answers were provided . Armband removed by staff ,Pt discharged from ED. W/C  offered at D/C  and Declined W/C at D/C and was escorted to lobby by RN.  

## 2018-06-26 NOTE — Discharge Instructions (Addendum)
Keep wound clean with peridex solution as prescribed. Ice and elevate for additional pain and swelling relief. Alternate between Ibuprofen and Tylenol for additional pain relief. Follow up with your primary care doctor or the [facility] in approximately 7 days for wound recheck. Monitor area for signs of infection to include, but not limited to: increasing pain, spreading redness, drainage/pus, worsening swelling, or fevers. Return to emergency department for emergent changing or worsening symptoms.
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
## 2018-08-23 ENCOUNTER — Other Ambulatory Visit: Payer: Self-pay

## 2018-08-23 ENCOUNTER — Encounter (HOSPITAL_COMMUNITY): Payer: Self-pay

## 2018-08-23 ENCOUNTER — Ambulatory Visit (HOSPITAL_COMMUNITY)
Admission: EM | Admit: 2018-08-23 | Discharge: 2018-08-23 | Disposition: A | Discharge status: Home / Self Care | Payer: Self-pay | Attending: Family Medicine | Admitting: Family Medicine

## 2018-08-23 DIAGNOSIS — N898 Other specified noninflammatory disorders of vagina: Secondary | ICD-10-CM | POA: Insufficient documentation

## 2018-08-23 MED ORDER — FLUCONAZOLE 150 MG PO TABS: ORAL_TABLET | ORAL | 0 refills | Status: DC

## 2018-08-23 NOTE — Discharge Instructions (Addendum)
We will start with treatment for yeast pending your vaginal swab.  Will notify of any positive findings and if any changes to treatment are needed.   Avoid intercourse until symptoms have resolved.   If worsening or no improvement please return or follow up with your gynecologist for recheck .

## 2018-08-23 NOTE — ED Triage Notes (Signed)
Patient presents to Urgent Care with complaints of vaginal itching and a white appearance to the skin of her vagina since yesterday. Patient reports she also has some discharge.
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
## 2018-08-25 LAB — CERVICOVAGINAL ANCILLARY ONLY
Bacterial vaginitis: POSITIVE — AB
Candida vaginitis: NEGATIVE
Chlamydia: NEGATIVE
Neisseria Gonorrhea: NEGATIVE
Trichomonas: POSITIVE — AB

## 2018-08-26 ENCOUNTER — Telehealth (HOSPITAL_COMMUNITY): Payer: Self-pay | Admitting: Emergency Medicine

## 2018-08-26 MED ORDER — METRONIDAZOLE 500 MG PO TABS: 500.0000 mg | ORAL_TABLET | Freq: Two times a day (BID) | ORAL | 0 refills | Status: AC
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
## 2018-08-30 ENCOUNTER — Telehealth (HOSPITAL_COMMUNITY): Payer: Self-pay | Admitting: Emergency Medicine

## 2018-08-30 NOTE — Telephone Encounter (Signed)
Attempted to reach patient x2. No answer at this time. Voicemail left.    

## 2018-09-01 ENCOUNTER — Telehealth (HOSPITAL_COMMUNITY): Payer: Self-pay | Admitting: Emergency Medicine

## 2018-09-01 NOTE — Telephone Encounter (Signed)
Patient contacted and made aware of all results, all questions answered.   

## 2019-05-25 ENCOUNTER — Other Ambulatory Visit: Payer: Self-pay

## 2019-05-25 ENCOUNTER — Emergency Department (HOSPITAL_COMMUNITY): Payer: Self-pay

## 2019-05-25 ENCOUNTER — Emergency Department (HOSPITAL_COMMUNITY)
Admission: EM | Admit: 2019-05-25 | Discharge: 2019-05-25 | Disposition: A | Discharge status: Home / Self Care | Payer: Self-pay | Attending: Emergency Medicine | Admitting: Emergency Medicine

## 2019-05-25 ENCOUNTER — Encounter (HOSPITAL_COMMUNITY): Payer: Self-pay | Admitting: Emergency Medicine

## 2019-05-25 DIAGNOSIS — N730 Acute parametritis and pelvic cellulitis: Secondary | ICD-10-CM | POA: Insufficient documentation

## 2019-05-25 DIAGNOSIS — A5901 Trichomonal vulvovaginitis: Secondary | ICD-10-CM | POA: Insufficient documentation

## 2019-05-25 LAB — CBC
HCT: 37.4 % (ref 36.0–46.0)
Hemoglobin: 12 g/dL (ref 12.0–15.0)
MCH: 29.6 pg (ref 26.0–34.0)
MCHC: 32.1 g/dL (ref 30.0–36.0)
MCV: 92.1 fL (ref 80.0–100.0)
Platelets: 191 10*3/uL (ref 150–400)
RBC: 4.06 MIL/uL (ref 3.87–5.11)
RDW: 13.3 % (ref 11.5–15.5)
WBC: 14.6 10*3/uL — ABNORMAL HIGH (ref 4.0–10.5)
nRBC: 0 % (ref 0.0–0.2)

## 2019-05-25 LAB — URINALYSIS, ROUTINE W REFLEX MICROSCOPIC
Glucose, UA: NEGATIVE mg/dL
Hgb urine dipstick: NEGATIVE
Ketones, ur: 80 mg/dL — AB
Nitrite: NEGATIVE
Protein, ur: 100 mg/dL — AB
Specific Gravity, Urine: 1.032 — ABNORMAL HIGH (ref 1.005–1.030)
pH: 5 (ref 5.0–8.0)

## 2019-05-25 LAB — COMPREHENSIVE METABOLIC PANEL
ALT: 18 U/L (ref 0–44)
AST: 7 U/L — ABNORMAL LOW (ref 15–41)
Albumin: 3.3 g/dL — ABNORMAL LOW (ref 3.5–5.0)
Alkaline Phosphatase: 57 U/L (ref 38–126)
Anion gap: 11 (ref 5–15)
BUN: 6 mg/dL (ref 6–20)
CO2: 24 mmol/L (ref 22–32)
Calcium: 8.4 mg/dL — ABNORMAL LOW (ref 8.9–10.3)
Chloride: 102 mmol/L (ref 98–111)
Creatinine, Ser: 0.96 mg/dL (ref 0.44–1.00)
GFR calc Af Amer: >60 mL/min (ref >60)
GFR calc non Af Amer: >60 mL/min (ref >60)
Glucose, Bld: 103 mg/dL — ABNORMAL HIGH (ref 70–99)
Potassium: 3.4 mmol/L — ABNORMAL LOW (ref 3.5–5.1)
Sodium: 137 mmol/L (ref 135–145)
Total Bilirubin: 0.8 mg/dL (ref 0.3–1.2)
Total Protein: 6.6 g/dL (ref 6.5–8.1)

## 2019-05-25 LAB — I-STAT BETA HCG BLOOD, ED (MC, WL, AP ONLY): I-stat hCG, quantitative: 6.9 m[IU]/mL — ABNORMAL HIGH (ref <5)

## 2019-05-25 LAB — WET PREP, GENITAL
Sperm: NONE SEEN
Yeast Wet Prep HPF POC: NONE SEEN

## 2019-05-25 LAB — LIPASE, BLOOD: Lipase: 17 U/L (ref 11–51)

## 2019-05-25 LAB — PREGNANCY, URINE: Preg Test, Ur: NEGATIVE

## 2019-05-25 MED ORDER — SODIUM CHLORIDE 0.9 % IV SOLN
1.0000 g | Freq: Once | INTRAVENOUS | Status: AC
  Administered 2019-05-25: 1 g via INTRAVENOUS
  Filled 2019-05-25: qty 10

## 2019-05-25 MED ORDER — DOXYCYCLINE HYCLATE 100 MG PO CAPS: 100.0000 mg | ORAL_CAPSULE | Freq: Two times a day (BID) | ORAL | 0 refills | Status: AC

## 2019-05-25 MED ORDER — IOHEXOL 300 MG/ML  SOLN
100.0000 mL | Freq: Once | INTRAMUSCULAR | Status: AC | PRN
  Administered 2019-05-25: 100 mL via INTRAVENOUS

## 2019-05-25 MED ORDER — SODIUM CHLORIDE 0.9 % IV BOLUS
1000.0000 mL | Freq: Once | INTRAVENOUS | Status: AC
  Administered 2019-05-25: 1000 mL via INTRAVENOUS

## 2019-05-25 MED ORDER — METRONIDAZOLE 500 MG PO TABS: 500.0000 mg | ORAL_TABLET | Freq: Two times a day (BID) | ORAL | 0 refills | Status: AC

## 2019-05-25 MED ORDER — MORPHINE SULFATE (PF) 4 MG/ML IV SOLN
4.0000 mg | Freq: Once | INTRAVENOUS | Status: AC
  Administered 2019-05-25: 4 mg via INTRAVENOUS
  Filled 2019-05-25: qty 1

## 2019-05-25 NOTE — ED Notes (Signed)
Called lab for add-on urine culture and preg.

## 2019-05-25 NOTE — ED Provider Notes (Signed)
[facility] EMERGENCY DEPARTMENT Provider Note   CSN: [id number] Arrival date & time: 05/25/19  1346     History Chief Complaint  Patient presents with  . Flank Pain    ADAMARYS SHALL is a 39 y.o. female.  The history is provided by the patient and medical records. No language interpreter was used.  Flank Pain     39 year old female with history of anemia presenting for evaluation of abdominal pain.  Patient report for the past 2 to 3 days she has had pain across abdomen radiates to her right flank.  She also endorsed having urinary frequency and urgency without burning urination.  Pain is sharp achy 7 out of 10.  No associated fever chills no nausea vomiting or diarrhea no hematuria.  She recently came off her menstruation.  She denies any vaginal discharge.  She still has an intact appendix.  Past Medical History:  Diagnosis Date  . Anemia   . Preterm labor   . Skin infection     Patient Active Problem List   Diagnosis Date Noted  . [redacted] weeks gestation of pregnancy   . Determine fetal presentation using ultrasound   . Active labor 05/28/2014  . NSVD (normal spontaneous vaginal delivery) 05/28/2014    Past Surgical History:  Procedure Laterality Date  . DILATION AND CURETTAGE OF UTERUS       OB History    Gravida  10   Para  6   Term  4   Preterm  2   AB  3   Living  6     SAB  2   TAB  1   Ectopic  0   Multiple  1   Live Births  6           Family History  Problem Relation Age of Onset  . Hypertension Mother   . Hypertension Father     Social History   Tobacco Use  . Smoking status: Current Every Day Smoker    Packs/day: 0.25    Types: Cigarettes    Last attempt to quit: 01/16/2014    Years since quitting: 5.3  . Smokeless tobacco: Never Used  Substance Use Topics  . Alcohol use: Yes    Comment: Socially  . Drug use: Yes    Types: Marijuana    Home Medications Prior to Admission medications    Medication Sig Start Date End Date Taking? Authorizing Provider  fluconazole (DIFLUCAN) 150 MG tablet Take 1 tablet today. If still with symptoms may repeat in 3 days. 08/23/18   Zigmund Gottron, NP    Allergies    Triamcinolone  Review of Systems   Review of Systems  Genitourinary: Positive for flank pain.  All other systems reviewed and are negative.   Physical Exam Updated Vital Signs BP (!) 148/101 (BP Location: Left Arm)   Pulse (!) 103   Temp 99.1 F (37.3 C) (Oral)   Resp 16   Ht 5\' 2"  (1.575 m)   Wt 66.2 kg   SpO2 100%   BMI 26.70 kg/m   Physical Exam Vitals and nursing note reviewed.  Constitutional:      General: She is not in acute distress.    Appearance: She is well-developed.  HENT:     Head: Atraumatic.  Eyes:     Conjunctiva/sclera: Conjunctivae normal.  Cardiovascular:     Rate and Rhythm: Tachycardia present.     Pulses: Normal pulses.     Heart  sounds: Normal heart sounds.  Pulmonary:     Effort: Pulmonary effort is normal.     Breath sounds: Normal breath sounds. No wheezing.  Abdominal:     General: Abdomen is flat.     Palpations: Abdomen is soft.     Tenderness: There is abdominal tenderness (Diffuse abdominal tenderness without guarding or rebound tenderness.  Negative Murphy sign, no pain at McBurney's point.). There is right CVA tenderness.  Musculoskeletal:     Cervical back: Neck supple.  Skin:    Findings: No rash.  Neurological:     Mental Status: She is alert.     ED Results / Procedures / Treatments   Labs (all labs ordered are listed, but only abnormal results are displayed) Labs Reviewed  URINE CULTURE - Abnormal; Notable for the following components:      Result Value   Culture MULTIPLE SPECIES PRESENT, SUGGEST RECOLLECTION (*)    All other components within normal limits  WET PREP, GENITAL - Abnormal; Notable for the following components:   Trich, Wet Prep PRESENT (*)    Clue Cells Wet Prep HPF POC PRESENT (*)     WBC, Wet Prep HPF POC MANY (*)    All other components within normal limits  COMPREHENSIVE METABOLIC PANEL - Abnormal; Notable for the following components:   Potassium 3.4 (*)    Glucose, Bld 103 (*)    Calcium 8.4 (*)    Albumin 3.3 (*)    AST 7 (*)    All other components within normal limits  CBC - Abnormal; Notable for the following components:   WBC 14.6 (*)    All other components within normal limits  URINALYSIS, ROUTINE W REFLEX MICROSCOPIC - Abnormal; Notable for the following components:   Color, Urine AMBER (*)    APPearance CLOUDY (*)    Specific Gravity, Urine 1.032 (*)    Bilirubin Urine SMALL (*)    Ketones, ur 80 (*)    Protein, ur 100 (*)    Leukocytes,Ua MODERATE (*)    Bacteria, UA MANY (*)    All other components within normal limits  I-STAT BETA HCG BLOOD, ED ([facility], [facility], [facility] ONLY) - Abnormal; Notable for the following components:   I-stat hCG, quantitative 6.9 (*)    All other components within normal limits  GC/CHLAMYDIA PROBE AMP ([place]) NOT AT [facility] - Abnormal; Notable for the following components:   Neisseria Gonorrhea **POSITIVE** (*)    All other components within normal limits  LIPASE, BLOOD  PREGNANCY, URINE    EKG None  Radiology No results found.  Procedures Procedures (including critical care time)  Medications Ordered in ED Medications  cefTRIAXone (ROCEPHIN) 1 g in sodium chloride 0.9 % 100 mL IVPB (has no administration in time range)  morphine 4 MG/ML injection 4 mg (has no administration in time range)  sodium chloride 0.9 % bolus 1,000 mL (has no administration in time range)  morphine 4 MG/ML injection 4 mg (4 mg Intravenous Given 05/25/19 1650)  sodium chloride 0.9 % bolus 1,000 mL (1,000 mLs Intravenous New Bag/Given 05/25/19 1657)    ED Course  I have reviewed the triage vital signs and the nursing notes.  Pertinent labs & imaging results that were available during my care of the patient were reviewed by me and considered  in my medical decision making (see chart for details).    MDM Rules/Calculators/A&P  BP 133/80   Pulse 88   Temp 99 F (37.2 C)   Resp 20   Ht 5\' 2"  (1.575 m)   Wt 66.2 kg   SpO2 100%   BMI 26.70 kg/m   Final Clinical Impression(s) / ED Diagnoses Final diagnoses:  PID (acute pelvic inflammatory disease)  Trichomonas vaginitis    Rx / DC Orders ED Discharge Orders         Ordered    doxycycline (VIBRAMYCIN) 100 MG capsule  2 times daily     05/25/19 2119    metroNIDAZOLE (FLAGYL) 500 MG tablet  2 times daily     05/25/19 2119         4:11 PM Patient here with right flank pain as well as diffuse abdominal pain for the past few days along with some urinary discomfort.  She does have a tender abdomen on exam and will benefit from abdominal pelvis CT scan for further evaluation.  Initial i-STAT beta-hCG is mildly elevated at 6.9.  Will obtain a confirmitory pregnancy test before CT scan.   7:02 PM preg test negative.  Pt currently receiving Rocephin for UTI, and IVF.  Urine culture sent.  CT of abd/pelvis ordered. Additional pain medication given.  Pt sign out to oncoming provider who will f/u on CT result.  If negative, pt may be treated for pyelonephritis with abx for 14 days.    2120, PA-C 06/02/19 1529    06/04/19, MD 06/03/19 [phone]

## 2019-05-25 NOTE — ED Triage Notes (Signed)
Pt endorses right flank pain and dark urine since yesterday. No relief with advil.

## 2019-05-25 NOTE — Discharge Instructions (Addendum)
You will need to complete the entire course of antibiotics regardless of symptom improvement prevent worsening or recurrence of your infection. We will contact you with results of your urine culture and other lab work when it is available when it is available. Return to the ER if you start to have worsening pain, abnormal vaginal bleeding, develop a fever.

## 2019-05-25 NOTE — ED Provider Notes (Signed)
Physical Exam  BP (!) 148/101 (BP Location: Left Arm)   Pulse (!) 103   Temp 99.1 F (37.3 C) (Oral)   Resp 16   Ht 5\' 2"  (1.575 m)   Wt 66.2 kg   SpO2 100%   BMI 26.70 kg/m   Physical Exam  ED Course/Procedures     Procedures  MDM   Care of patient assumed from PA Clever at 7:00 PM .  Agree with history, physical exam and plan.  See their note for further details.  Briefly, 39 y.o. female with PMH/PSH as below who presents with abdominal pain for the past 2 to 3 days.  Reports urinary frequency and urgency without dysuria.  Reports generalized pain on exam.  Patient denies any vaginal discharge initially.  Urinalysis with signs of infection including leukocytes, pyuria and some bacteria.  Lab work showing leukocytosis of 14.6.  Past Medical History:  Diagnosis Date  . Anemia   . Preterm labor   . Skin infection    Past Surgical History:  Procedure Laterality Date  . DILATION AND CURETTAGE OF UTERUS        Current Plan: Plan is to obtain CT of the abdomen pelvis to rule out pyelonephritis versus appendicitis versus infected kidney stone.  If negative, patient will be discharged home with antibiotic treatment for UTI.   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  She is not specifically having pain in the left upper quadrant so I have a low suspicion for appendicitis although this is not completely visualized in the CT scan. Notable changes consistent with PID noted on CT.  Patient then tells me she did have unprotected sexual intercourse about 5 days ago, which is about 2 days prior to her symptom onset.  She noted some foul-smelling and thick vaginal discharge today. I was able to complete a pelvic exam without any cervical motion tenderness, adnexal tenderness but did show a large amount of vaginal discharge in the vaginal vault.  Cervix is closed without any abnormalities.  Wet prep positive for  trichomonas, clue cells.  Patient was given IV Rocephin here but will need to complete the rest of the course of treatment for PID.  Will wait on urine culture results as her urine findings could be secondary to her PID.  Feel that she will benefit from PCP follow-up.  I educated her on return precautions.  Also informed her of her incidental finding of her malrotation of her intestines.    Significant labs/images: Labs Reviewed  WET PREP, GENITAL - Abnormal; Notable for the following components:      Result Value   Trich, Wet Prep PRESENT (*)    Clue Cells Wet Prep HPF POC PRESENT (*)    WBC, Wet Prep HPF POC MANY (*)    All other components within normal limits  COMPREHENSIVE METABOLIC PANEL - Abnormal; Notable for the following components:   Potassium 3.4 (*)    Glucose, Bld 103 (*)    Calcium 8.4 (*)    Albumin 3.3 (*)    AST 7 (*)    All other components within normal limits  CBC - Abnormal; Notable for the following components:   WBC 14.6 (*)    All other components within normal limits  URINALYSIS, ROUTINE W REFLEX MICROSCOPIC - Abnormal; Notable for the following components:   Color, Urine AMBER (*)    APPearance CLOUDY (*)    Specific Gravity, Urine  1.032 (*)    Bilirubin Urine SMALL (*)    Ketones, ur 80 (*)    Protein, ur 100 (*)    Leukocytes,Ua MODERATE (*)    Bacteria, UA MANY (*)    All other components within normal limits  I-STAT BETA HCG BLOOD, ED ([facility], [facility], [facility] ONLY) - Abnormal; Notable for the following components:   I-stat hCG, quantitative 6.9 (*)    All other components within normal limits  URINE CULTURE  LIPASE, BLOOD  PREGNANCY, URINE  GC/CHLAMYDIA PROBE AMP ([place]) NOT AT [facility]   CT ABDOMEN PELVIS W CONTRAST  Result Date: 05/25/2019 CLINICAL DATA:  Abdominal pain.  Right flank pain and dark urine. EXAM: CT ABDOMEN AND PELVIS WITH CONTRAST TECHNIQUE: Multidetector CT imaging of the abdomen and pelvis was performed using the standard protocol  following bolus administration of intravenous contrast. CONTRAST:  OMNIPAQUE IOHEXOL 300 MG/ML  SOLN COMPARISON:  None. FINDINGS: Lower chest: There are airspace opacities at the lung bases bilaterally, right worse than left, favored to represent areas of atelectasis.The heart size is normal. Hepatobiliary: The liver is normal. Normal gallbladder.There is no biliary ductal dilation. Pancreas: Normal contours without ductal dilatation. No peripancreatic fluid collection. Spleen: No splenic laceration or hematoma. Adrenals/Urinary Tract: --Adrenal glands: No adrenal hemorrhage. --Right kidney/ureter: No hydronephrosis or perinephric hematoma. --Left kidney/ureter: No hydronephrosis or perinephric hematoma. --Urinary bladder: Unremarkable. Stomach/Bowel: --Stomach/Duodenum: No hiatal hernia or other gastric abnormality. Normal duodenal course and caliber. 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 There is no evidence for obstruction. --Colon: The cecum appears to be in the left upper quadrant. --Appendix: The appendix is not reliably identified but is favored to reside within the left upper quadrant. Vascular/Lymphatic: Normal course and caliber of the major abdominal vessels. --No retroperitoneal lymphadenopathy. --No mesenteric lymphadenopathy. --No pelvic or inguinal lymphadenopathy. Reproductive: There is a questionable serpiginous structure involving the left adnexa. There appears to be some infiltration of the patient's pelvic fat. The uterus is slightly enlarged and heterogeneous. Other: There is a trace amount of free fluid in the patient's abdomen. The abdominal wall is normal. Musculoskeletal. No acute displaced fractures. IMPRESSION: 1. 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 The cecum is located in the left upper quadrant, however the appendix is not reliably identified. Appendicitis cannot be reliably excluded  on this exam. 2. Infiltration of the pelvic fat is noted. There is suggestion of a serpiginous structure involving the left adnexa raising concern for pelvic inflammatory disease. Correlation with patient's symptoms and ultrasound is recommended. 3. No CT evidence for pyelonephritis. No hydronephrosis. No radiopaque kidney stones. 4. Trace amount of free fluid in the patient's abdomen, presumably reactive. 5. Trace bibasilar atelectasis. Electronically Signed   By: Katherine Mantle M.D.   On: 05/25/2019 20:08     I personally reviewed and interpreted all labs.  Patient is hemodynamically stable, in NAD, and able to ambulate in the ED. Evaluation does not show pathology that would require ongoing emergent intervention or inpatient treatment. I have personally reviewed and interpreted all lab work and imaging at today's ED visit. I explained the diagnosis to the patient. Pain has been managed and has no complaints prior to discharge. Patient is comfortable with above plan and is stable for discharge at this time. All questions were answered prior to disposition. Strict return precautions for returning to the ED were discussed. Encouraged follow up with PCP.   An After Visit Summary was  printed and given to the patient.   Portions of this note were generated with Scientist, clinical (histocompatibility and immunogenetics). Dictation errors may occur despite best attempts at proofreading.         Dietrich Pates, PA-C 05/25/19 2147    Melene Plan, DO 05/25/19 2239

## 2019-05-25 NOTE — ED Notes (Signed)
Pt transported to CT ?

## 2019-05-26 LAB — URINE CULTURE

## 2019-05-27 LAB — GC/CHLAMYDIA PROBE AMP (~~LOC~~) NOT AT ARMC
Chlamydia: NEGATIVE
Neisseria Gonorrhea: POSITIVE — AB

## 2020-04-24 ENCOUNTER — Emergency Department (HOSPITAL_COMMUNITY): Payer: Self-pay

## 2020-04-24 ENCOUNTER — Emergency Department (HOSPITAL_COMMUNITY)
Admission: EM | Admit: 2020-04-24 | Discharge: 2020-04-25 | Disposition: A | Discharge status: Home / Self Care | Payer: Self-pay | Attending: Emergency Medicine | Admitting: Emergency Medicine

## 2020-04-24 ENCOUNTER — Other Ambulatory Visit: Payer: Self-pay

## 2020-04-24 ENCOUNTER — Encounter (HOSPITAL_COMMUNITY): Payer: Self-pay | Admitting: *Deleted

## 2020-04-24 DIAGNOSIS — Z716 Tobacco abuse counseling: Secondary | ICD-10-CM | POA: Insufficient documentation

## 2020-04-24 DIAGNOSIS — F17210 Nicotine dependence, cigarettes, uncomplicated: Secondary | ICD-10-CM | POA: Insufficient documentation

## 2020-04-24 DIAGNOSIS — R079 Chest pain, unspecified: Secondary | ICD-10-CM | POA: Insufficient documentation

## 2020-04-24 LAB — CBC
HCT: 37.9 % (ref 36.0–46.0)
Hemoglobin: 12.1 g/dL (ref 12.0–15.0)
MCH: 29.4 pg (ref 26.0–34.0)
MCHC: 31.9 g/dL (ref 30.0–36.0)
MCV: 92 fL (ref 80.0–100.0)
Platelets: 170 10*3/uL (ref 150–400)
RBC: 4.12 MIL/uL (ref 3.87–5.11)
RDW: 14 % (ref 11.5–15.5)
WBC: 5.9 10*3/uL (ref 4.0–10.5)
nRBC: 0 % (ref 0.0–0.2)

## 2020-04-24 LAB — TROPONIN I (HIGH SENSITIVITY)
Troponin I (High Sensitivity): 4 ng/L (ref <18)
Troponin I (High Sensitivity): 4 ng/L (ref <18)

## 2020-04-24 LAB — BASIC METABOLIC PANEL
Anion gap: 10 (ref 5–15)
BUN: 11 mg/dL (ref 6–20)
CO2: 24 mmol/L (ref 22–32)
Calcium: 8.6 mg/dL — ABNORMAL LOW (ref 8.9–10.3)
Chloride: 105 mmol/L (ref 98–111)
Creatinine, Ser: 0.85 mg/dL (ref 0.44–1.00)
GFR, Estimated: >60 mL/min (ref >60)
Glucose, Bld: 88 mg/dL (ref 70–99)
Potassium: 3.7 mmol/L (ref 3.5–5.1)
Sodium: 139 mmol/L (ref 135–145)

## 2020-04-24 LAB — I-STAT BETA HCG BLOOD, ED (MC, WL, AP ONLY): I-stat hCG, quantitative: <5 m[IU]/mL (ref <5)

## 2020-04-24 NOTE — ED Triage Notes (Signed)
Pt states onset of cp this morning when she woke up this morning, feels like it goes through to her back. Took ibuprofen with some relief this morning, pain returned this afternoon.

## 2020-04-25 MED ORDER — CYCLOBENZAPRINE HCL 10 MG PO TABS: 10.0000 mg | ORAL_TABLET | Freq: Three times a day (TID) | ORAL | 0 refills | Status: AC | PRN

## 2020-04-25 MED ORDER — KETOROLAC TROMETHAMINE 60 MG/2ML IM SOLN
30.0000 mg | Freq: Once | INTRAMUSCULAR | Status: AC
  Administered 2020-04-25: 30 mg via INTRAMUSCULAR
  Filled 2020-04-25: qty 2

## 2020-04-25 MED ORDER — CYCLOBENZAPRINE HCL 10 MG PO TABS
5.0000 mg | ORAL_TABLET | Freq: Once | ORAL | Status: AC
  Administered 2020-04-25: 5 mg via ORAL
  Filled 2020-04-25: qty 1

## 2020-04-25 MED ORDER — VARENICLINE TARTRATE 0.5 MG PO TABS: 0.5000 mg | ORAL_TABLET | Freq: Two times a day (BID) | ORAL | 0 refills | Status: AC

## 2020-04-25 NOTE — Medical Student Note (Signed)
[facility]-EMERGENCY DEPT Provider Student Note For educational purposes for Medical, PA and NP students only and not part of the legal medical record.   CSN: [id number] Arrival date & time: 04/24/20  1840      History   Chief Complaint Chief Complaint  Patient presents with  . Chest Pain    HPI Christine Ingram is a 40 y.o. female with PMH anemia who presents to ED with back pain. States that last week on Thursday she felt like someone "punched her in the right shoulder blade." States she was in the car when this happened. Denies chest pain, shortness of breath, n/v diaphoresis at that time.   Pain went away for a few days, and states this morning she began having mid chest pain that radiated to her back. States it has been constant all day. She took ibuprofen with mild relief. Denies SOB, diaphoresis, N/V, palpitations with chest pain. States that she opened her refrigerator and "could feel it more."   Pain has since resolved since presenting to the ED. She notes that a few days ago she let her 5yo son "walk on her back." states after doing so he started to jump on her back. She is unsure if this is contributing to pain symptoms.   Past Medical History:  Diagnosis Date  . Anemia   . Preterm labor   . Skin infection     Patient Active Problem List   Diagnosis Date Noted  . [redacted] weeks gestation of pregnancy   . Determine fetal presentation using ultrasound   . Active labor 05/28/2014  . NSVD (normal spontaneous vaginal delivery) 05/28/2014    Past Surgical History:  Procedure Laterality Date  . DILATION AND CURETTAGE OF UTERUS      OB History    Gravida  10   Para  6   Term  4   Preterm  2   AB  3   Living  6     SAB  2   IAB  1   Ectopic  0   Multiple  1   Live Births  6           Home Medications    Prior to Admission medications   Medication Sig Start Date End Date Taking? Authorizing Provider  fluconazole (DIFLUCAN) 150 MG tablet Take 1 tablet  today. If still with symptoms may repeat in 3 days. Patient not taking: Reported on 05/25/2019 08/23/18   Georgetta Haber, NP   Family History Family History  Problem Relation Age of Onset  . Hypertension Mother   . Hypertension Father     Social History Social History   Tobacco Use  . Smoking status: Current Every Day Smoker    Packs/day: 0.25    Types: Cigarettes    Last attempt to quit: 01/16/2014    Years since quitting: 6.2  . Smokeless tobacco: Never Used  Vaping Use  . Vaping Use: Never used  Substance Use Topics  . Alcohol use: Yes    Comment: Socially  . Drug use: Yes    Types: Marijuana     Allergies   Triamcinolone   Review of Systems Review of Systems  Constitutional: Positive for fatigue. Negative for appetite change, diaphoresis and fever.  HENT: Negative for sinus pressure.   Eyes: Negative.   Respiratory: Positive for chest tightness. Negative for cough and shortness of breath.   Cardiovascular: Positive for chest pain. Negative for palpitations and leg swelling.  Gastrointestinal: Positive  for diarrhea. Negative for nausea and vomiting.  Endocrine: Negative.   Genitourinary: Negative.   Musculoskeletal: Positive for back pain.  Skin: Negative.   Allergic/Immunologic: Negative.   Neurological: Negative for dizziness and light-headedness.  Hematological: Negative.   Psychiatric/Behavioral: Negative.     Physical Exam Updated Vital Signs BP (!) 159/96 (BP Location: Right Arm)   Pulse 81   Temp 98.6 F (37 C) (Oral)   Resp 16   SpO2 97%   Physical Exam Constitutional:      Appearance: She is well-developed. She is not ill-appearing or diaphoretic.  HENT:     Head: Normocephalic.  Eyes:     Extraocular Movements: Extraocular movements intact.     Pupils: Pupils are equal, round, and reactive to light.  Cardiovascular:     Rate and Rhythm: Normal rate and regular rhythm.     Pulses:          Radial pulses are 2+ on the right side and 2+  on the left side.     Heart sounds: Normal heart sounds. No murmur heard.   Pulmonary:     Effort: Pulmonary effort is normal.     Breath sounds: Normal breath sounds.  Chest:     Chest wall: Tenderness present. No deformity.  Abdominal:     General: Bowel sounds are normal.     Palpations: Abdomen is soft.  Musculoskeletal:        General: Normal range of motion.     Right shoulder: Tenderness present.       Arms:     Cervical back: Normal range of motion.     Right lower leg: No edema.     Left lower leg: No edema.  Skin:    General: Skin is warm and dry.     Capillary Refill: Capillary refill takes less than 2 seconds.  Neurological:     General: No focal deficit present.     Mental Status: She is alert.  Psychiatric:        Mood and Affect: Mood normal.      ED Treatments / Results  Labs (all labs ordered are listed, but only abnormal results are displayed) Labs Reviewed  BASIC METABOLIC PANEL - Abnormal; Notable for the following components:      Result Value   Calcium 8.6 (*)    All other components within normal limits  CBC  I-STAT BETA HCG BLOOD, ED ([facility], [facility], [facility] ONLY)  TROPONIN I (HIGH SENSITIVITY)  TROPONIN I (HIGH SENSITIVITY)    EKG  Radiology DG Chest 2 View  Result Date: 04/24/2020 CLINICAL DATA:  Chest pain. Additional history provided: Patient reports mid chest pain radiating to right upper back since this morning. EXAM: CHEST - 2 VIEW COMPARISON:  No pertinent prior exams available for comparison. FINDINGS: Heart size within normal limits. No appreciable airspace consolidation or pulmonary edema. No evidence of pleural effusion or pneumothorax. No acute bony abnormality identified. IMPRESSION: No evidence of active cardiopulmonary disease. Electronically Signed   By: Jackey Loge DO   On: 04/24/2020 19:52   Procedures Procedures (including critical care time)  Medications Ordered in ED Medications  ketorolac (TORADOL) injection 30 mg (has no  administration in time range)  cyclobenzaprine (FLEXERIL) tablet 5 mg (has no administration in time range)   Initial Impression / Assessment and Plan / ED Course  I have reviewed the triage vital signs and the nursing notes.  Pertinent labs & imaging results that were available during my care  of the patient were reviewed by me and considered in my medical decision making (see chart for details).  Christine Ingram is a 39yoF with PMH of anemia who presented to ED for nonspecific chest and right shoulder pain.  Differential diagnosis includes ACS, pulmonary embolism, dissection, infection, MSK.  Not likely ACS - troponin x2 negative, EKG unchanged from baseline.  Not likely PE - no SOB, hypoxia, tachycardia, low grade fever, lower extremity edema or pain, no recent travel Not likely dissection - pt is non-toxic appearing, pulses equal bilaterally, not tachycardic, hypertensive Not likely infection - WBC wnl, no fevers, cough, CXR wnl   Likely MSK pain given reproducibility, and history. Will prescribe muscle relaxant. Encourage OTC pain relief with ibuprofen. Can ice/heat if this provides relief of symptoms.   Final Clinical Impressions(s) / ED Diagnoses   Final diagnoses:  Nonspecific chest pain  Encounter for smoking cessation counseling   New Prescriptions New Prescriptions   CYCLOBENZAPRINE (FLEXERIL) 10 MG TABLET    Take 1 tablet (10 mg total) by mouth 3 (three) times daily as needed for muscle spasms.   VARENICLINE (CHANTIX) 0.5 MG TABLET    Take 1 tablet (0.5 mg total) by mouth 2 (two) times daily.
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
## 2022-12-19 IMAGING — CR DG CHEST 2V
2 series · 2 of 2 positions shown · non-contrast
Comparison: No pertinent prior exams available for comparison.

CLINICAL DATA: Chest pain. Additional history provided: Patient
reports mid chest pain radiating to right upper back since this
morning.

EXAM:
CHEST - 2 VIEW

[chest pa]
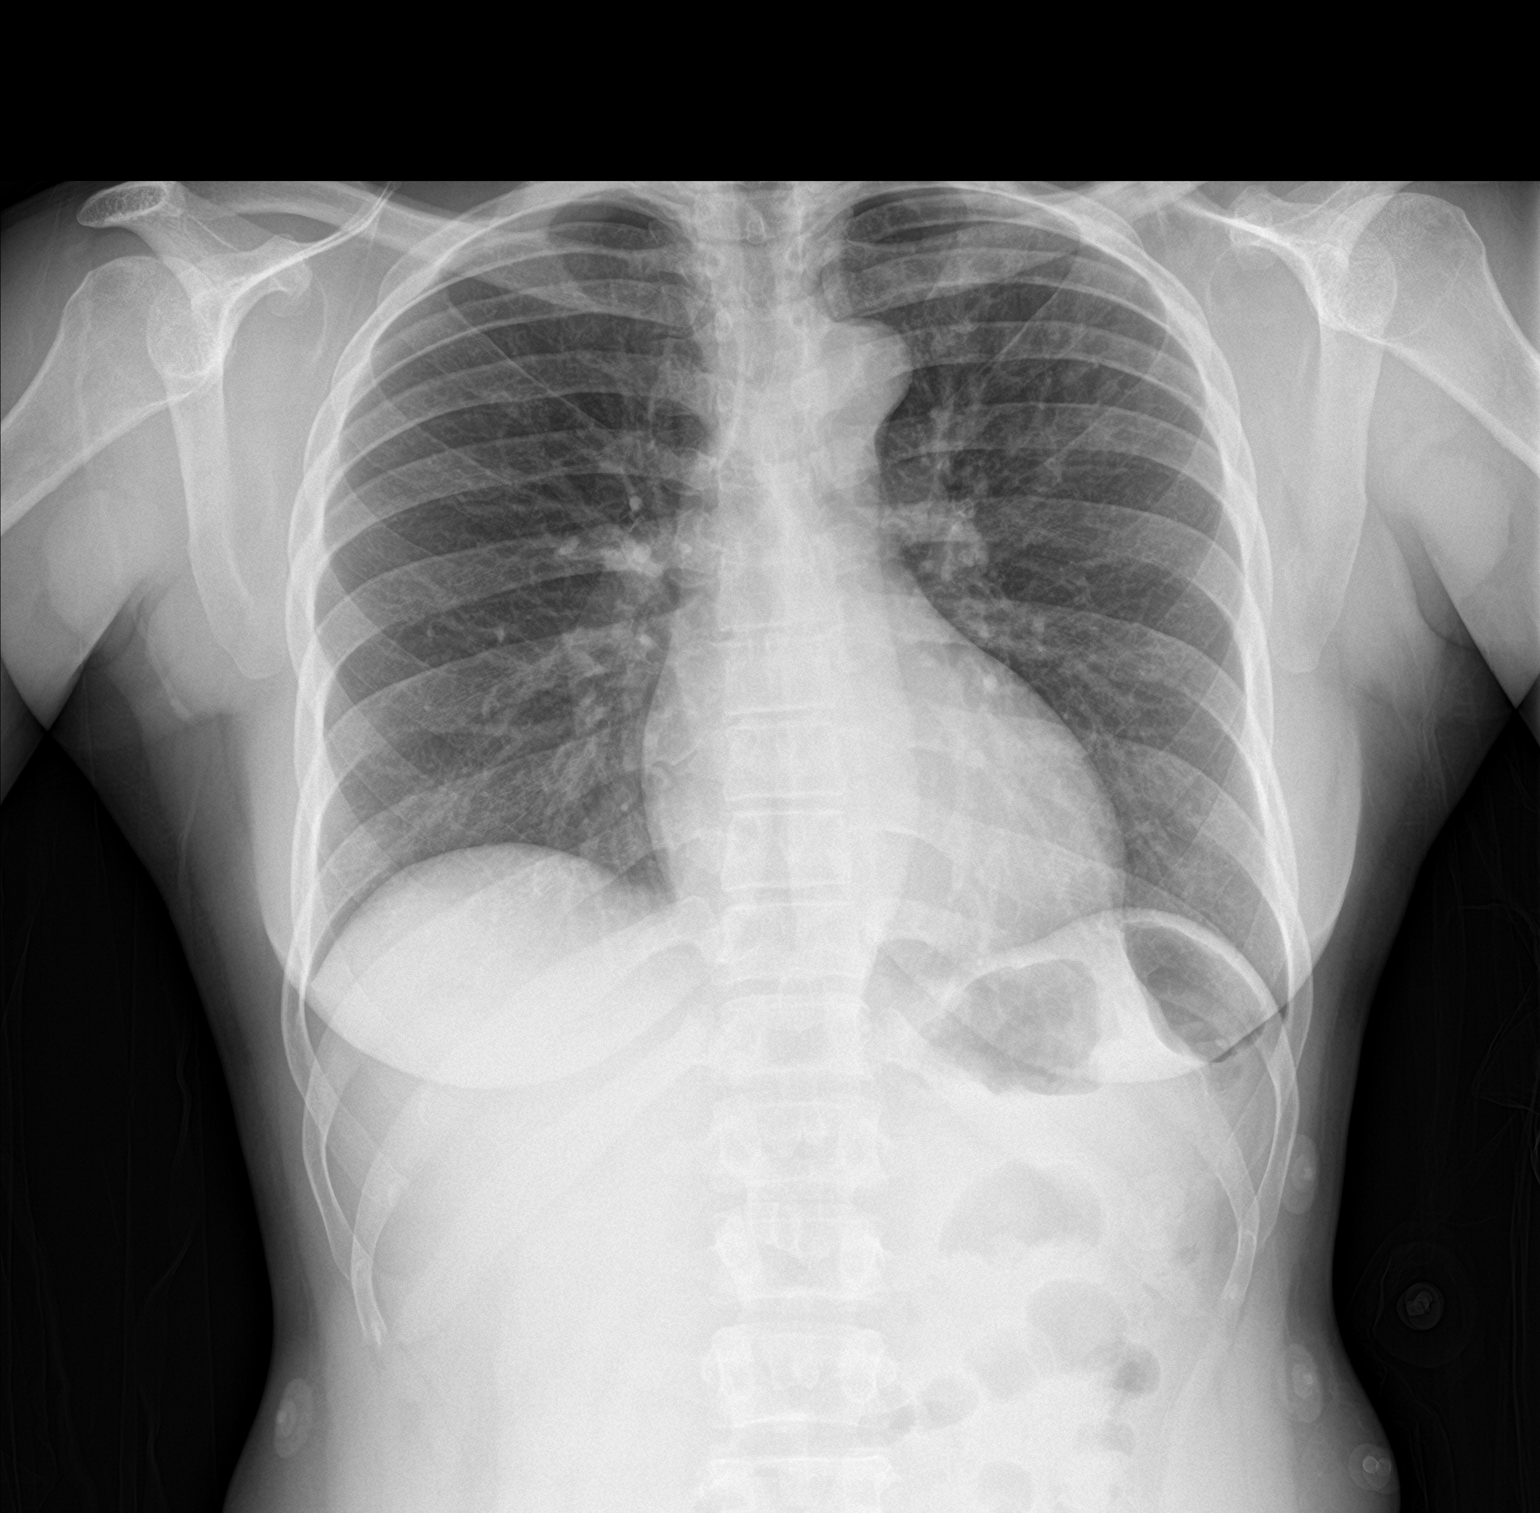

[chest lat]
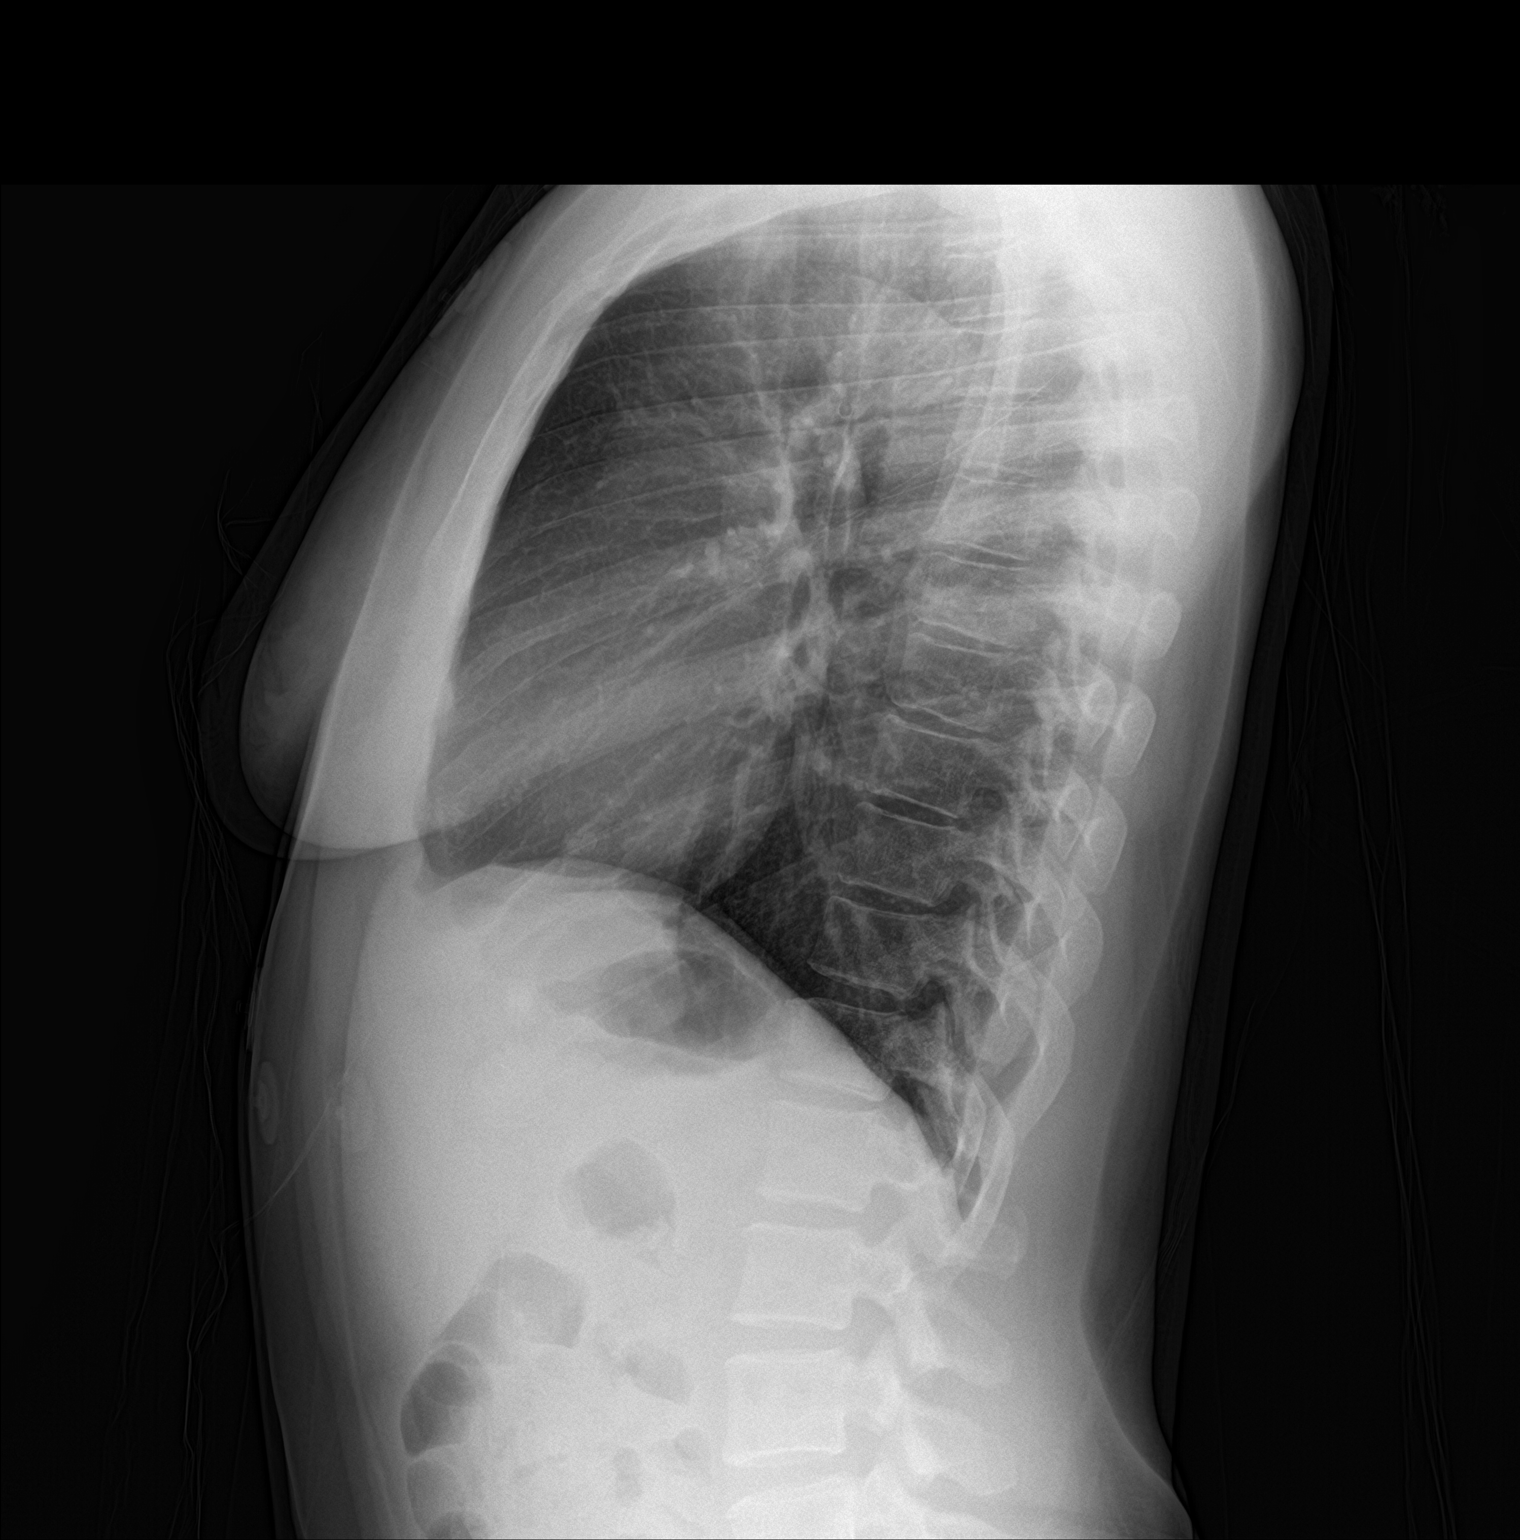

[2 of 2 positions shown; findings below may reference images not displayed]

FINDINGS: Heart size within normal limits. No appreciable airspace
consolidation or pulmonary edema. No evidence of pleural effusion or
pneumothorax. No acute bony abnormality identified.
IMPRESSION: No evidence of active cardiopulmonary disease.

## 2024-01-04 ENCOUNTER — Emergency Department (HOSPITAL_COMMUNITY)
Admission: EM | Admit: 2024-01-04 | Discharge: 2024-01-05 | Disposition: A | Discharge status: Home / Self Care | Payer: Self-pay | Attending: Emergency Medicine | Admitting: Emergency Medicine

## 2024-01-04 ENCOUNTER — Other Ambulatory Visit: Payer: Self-pay

## 2024-01-04 ENCOUNTER — Emergency Department (HOSPITAL_COMMUNITY): Payer: Self-pay

## 2024-01-04 ENCOUNTER — Encounter (HOSPITAL_COMMUNITY): Payer: Self-pay

## 2024-01-04 DIAGNOSIS — I159 Secondary hypertension, unspecified: Secondary | ICD-10-CM | POA: Insufficient documentation

## 2024-01-04 DIAGNOSIS — R519 Headache, unspecified: Secondary | ICD-10-CM

## 2024-01-04 DIAGNOSIS — I10 Essential (primary) hypertension: Secondary | ICD-10-CM | POA: Insufficient documentation

## 2024-01-04 LAB — CBC WITH DIFFERENTIAL/PLATELET
Abs Immature Granulocytes: 0.01 K/uL (ref 0.00–0.07)
Basophils Absolute: 0 K/uL (ref 0.0–0.1)
Basophils Relative: 0 %
Eosinophils Absolute: 0.1 K/uL (ref 0.0–0.5)
Eosinophils Relative: 3 %
HCT: 37.6 % (ref 36.0–46.0)
Hemoglobin: 11.9 g/dL — ABNORMAL LOW (ref 12.0–15.0)
Immature Granulocytes: 0 %
Lymphocytes Relative: 39 %
Lymphs Abs: 1.4 K/uL (ref 0.7–4.0)
MCH: 29.2 pg (ref 26.0–34.0)
MCHC: 31.6 g/dL (ref 30.0–36.0)
MCV: 92.4 fL (ref 80.0–100.0)
Monocytes Absolute: 0.3 K/uL (ref 0.1–1.0)
Monocytes Relative: 10 %
Neutro Abs: 1.7 K/uL (ref 1.7–7.7)
Neutrophils Relative %: 48 %
Platelets: 224 K/uL (ref 150–400)
RBC: 4.07 MIL/uL (ref 3.87–5.11)
RDW: 15.4 % (ref 11.5–15.5)
WBC: 3.6 K/uL — ABNORMAL LOW (ref 4.0–10.5)
nRBC: 0 % (ref 0.0–0.2)

## 2024-01-04 LAB — COMPREHENSIVE METABOLIC PANEL WITH GFR
ALT: 14 U/L (ref 0–44)
AST: 18 U/L (ref 15–41)
Albumin: 3.5 g/dL (ref 3.5–5.0)
Alkaline Phosphatase: 43 U/L (ref 38–126)
Anion gap: 9 (ref 5–15)
BUN: 5 mg/dL — ABNORMAL LOW (ref 6–20)
CO2: 26 mmol/L (ref 22–32)
Calcium: 8.4 mg/dL — ABNORMAL LOW (ref 8.9–10.3)
Chloride: 104 mmol/L (ref 98–111)
Creatinine, Ser: 0.86 mg/dL (ref 0.44–1.00)
GFR, Estimated: >60 mL/min (ref >60)
Glucose, Bld: 96 mg/dL (ref 70–99)
Potassium: 3.5 mmol/L (ref 3.5–5.1)
Sodium: 139 mmol/L (ref 135–145)
Total Bilirubin: 0.7 mg/dL (ref 0.0–1.2)
Total Protein: 6.7 g/dL (ref 6.5–8.1)

## 2024-01-04 LAB — PREGNANCY, URINE: Preg Test, Ur: NEGATIVE

## 2024-01-04 NOTE — ED Provider Triage Note (Signed)
 Emergency Medicine Provider Triage Evaluation Note  ARLENA MARSAN , a 43 y.o. female  was evaluated in triage.  Pt complains of right sided headache for the past 3 days and some blurred vision. Also has photosensitivity. Denies any head trauma or double vision.  She also reports her blood pressure was elevated today at home which is abnormal for her. No chest pain, shortness of breath, dizziness, slurred speech, facial droop  Review of Systems  Positive: As above Negative: As above  Physical Exam  BP (!) 192/114   Pulse 86   Temp 97.9 F (36.6 C)   Resp 18   SpO2 98%  Gen:   Awake, no distress   Resp:  Normal effort  MSK:   Moves extremities without difficulty  Other:  No focal neurologic deficits.  Pupils equal and reactive bilaterally.  Medical Decision Making  Medically screening exam initiated at 5:28 PM.  Appropriate orders placed.  EVALISE ABRUZZESE was informed that the remainder of the evaluation will be completed by another provider, this initial triage assessment does not replace that evaluation, and the importance of remaining in the ED until their evaluation is complete.     Veta Palma, PA-C 01/04/24 1728

## 2024-01-04 NOTE — ED Provider Notes (Signed)
 [place] EMERGENCY DEPARTMENT AT [facility] Provider Note   CSN: [id number] Arrival date & time: 01/04/24  1639     History Chief Complaint  Patient presents with   Hypertension   Headache    HPI Christine Ingram is a 43 y.o. female presenting for chief complaint of blurry vision, headaches over the last 3 days.  Patient has a distant history of migraines but otherwise healthy 43 year old female.  States she had a distant history of hypertension but had been well-controlled with lifestyle modifications.  Denies fevers chills nausea vomiting syncope shortness of breath.  Headache is right-sided, persistent in nature over 3 days.  Onset relatively quickly she reports. Denies any other neurologic symptoms at this time. Blurry vision she describes as difficulty focusing her eyes.  Once her eyes focused she states that they can read letters clearly but that it is taking longer for her to use her phone or text things out over her baseline.   Patient's recorded medical, surgical, social, medication list and allergies were reviewed in the Snapshot window as part of the initial history.   Review of Systems   Review of Systems  Constitutional:  Negative for chills and fever.  HENT:  Negative for ear pain and sore throat.   Eyes:  Positive for visual disturbance. Negative for pain.  Respiratory:  Negative for cough and shortness of breath.   Cardiovascular:  Negative for chest pain and palpitations.  Gastrointestinal:  Negative for abdominal pain and vomiting.  Genitourinary:  Negative for dysuria and hematuria.  Musculoskeletal:  Negative for arthralgias and back pain.  Skin:  Negative for color change and rash.  Neurological:  Positive for headaches. Negative for seizures and syncope.  All other systems reviewed and are negative.   Physical Exam Updated Vital Signs BP (!) 181/102 (BP Location: Left Arm)   Pulse 72   Temp 98.1 F (36.7 C) (Oral)   Resp 16   SpO2 100%   Physical Exam Vitals and nursing note reviewed.  Constitutional:      General: She is not in acute distress.    Appearance: She is well-developed.  HENT:     Head: Normocephalic and atraumatic.  Eyes:     Conjunctiva/sclera: Conjunctivae normal.  Cardiovascular:     Rate and Rhythm: Normal rate and regular rhythm.     Heart sounds: No murmur heard. Pulmonary:     Effort: Pulmonary effort is normal. No respiratory distress.     Breath sounds: Normal breath sounds.  Abdominal:     Palpations: Abdomen is soft.     Tenderness: There is no abdominal tenderness.  Musculoskeletal:        General: No swelling.     Cervical back: Neck supple.  Skin:    General: Skin is warm and dry.     Capillary Refill: Capillary refill takes less than 2 seconds.  Neurological:     Mental Status: She is alert.  Psychiatric:        Mood and Affect: Mood normal.      ED Course/ Medical Decision Making/ A&P    Procedures .Critical Care  Performed by: Jerral Meth, MD Authorized by: Jerral Meth, MD   Critical care provider statement:    Critical care time (minutes):  30   Critical care was necessary to treat or prevent imminent or life-threatening deterioration of the following conditions:  Circulatory failure   Critical care was time spent personally by me on the following activities:  Development  of treatment plan with patient or surrogate, discussions with consultants, evaluation of patient's response to treatment, examination of patient, ordering and review of laboratory studies, ordering and review of radiographic studies, ordering and performing treatments and interventions, pulse oximetry, re-evaluation of patient's condition and review of old charts    Medications Ordered in ED Medications  hydrALAZINE (APRESOLINE) injection 5 mg (5 mg Intravenous Given 01/05/24 0051)  metoCLOPramide (REGLAN) injection 10 mg (10 mg Intravenous Given 01/05/24 0050)  diphenhydrAMINE  (BENADRYL )  injection 25 mg (25 mg Intravenous Given 01/05/24 0049)  lactated ringers  bolus 1,000 mL (0 mLs Intravenous Stopped 01/05/24 0217)  lactated ringers  bolus 1,000 mL (0 mLs Intravenous Stopped 01/05/24 0217)  iohexol  (OMNIPAQUE ) 350 MG/ML injection 75 mL (75 mLs Intravenous Contrast Given 01/05/24 0120)  amLODipine (NORVASC) tablet 5 mg (5 mg Oral Given 01/05/24 0227)   Medical Decision Making:   Christine Ingram is a 43 y.o. female who presented to the ED today with chief complaint of headache detailed above.    Patient placed on continuous vitals and telemetry monitoring while in ED which was reviewed periodically.   Complete initial physical exam performed, notably the patient  was hemodynamically stable no acute distress.  Grossly hypertensive on arrival.    Reviewed and confirmed nursing documentation for past medical history, family history, social history.    Initial Assessment:   With the patient's presentation of headache, most likely diagnosis is tension type headaches vs atypical migraines. Other diagnoses were considered including (but not limited to) intracranial mass, intracranial hemorrhage, intracranial infection including meningitis vs encephalitis, GCA, trigeminal neuralgia. These are considered less likely due to history of present illness and physical exam findings.    This is most consistent with an acute life/limb threatening illness complicated by underlying chronic conditions.   Timeline and slow onset is not consistent with SAH/ICH   Age and description of pain is not consistent with GCA   Lack of fever,meningismus is not consistent with meningitis/encephalitis   Initial Plan:  Will initiate treatment with Compazine/Benardryll/NSAIDS/Tylenol  for treatment of nonspecific headache  Screening labs including CBC and Metabolic panel to evaluate for infectious or metabolic etiology of disease.  Urinalysis with reflex culture ordered to evaluate for UTI or relevant  urologic/nephrologic pathology.  CTH to evaluate for structural IC etiology will follow self with a CTA if negative due to patient presenting outside of 6 hours.  This would allow for further rule out of underlying aneurysm or severe nature of symptoms. Will treat her hypertensive urgency with IV medications and plan for serial reassessments and transition to oral. Objective evaluation as below reviewed   Initial Study Results:   Laboratory  All laboratory results reviewed without evidence of clinically relevant pathology.    Radiology:  All images reviewed independently. Agree with radiology report at this time.   CT ANGIO HEAD NECK W WO CM  Final Result    CT Head Wo Contrast  Final Result        Final Assessment and Plan:   Lab work benign, blood pressure decreased approximately 20% during observation with treatment.  Headache completely resolved on serial assessment.  CT scans with no intracranial pathology. Shared medical decision making with patient.  Given involvement of visual symptoms, this could be the start of either hypertensive crisis or possibly alternative pathology such as dural venous sinus thrombosis.  However given resolution of all of the headache symptoms with typical migraine treatments and patient's history of migraines, she favors that this was likely  a breakthrough migraine versus from her elevated blood pressure.  She was transition to oral amlodipine and continued to have downtrending pressures on reassessment.  Headache remained resolved after treatment and patient was observed a total of 10 hours with no deterioration or symptoms.  She states all of the visual symptoms have completely resolved.  Ultimately, patient would like to start antihypertensive treatment and be referred to neurology for outpatient evaluation.  IIH would also be on the differential but less likely given the presentation at this time.  Patient will follow-up with neurology for further diagnostic  care and management.  She will follow-up with a primary care doctor for ongoing antihypertensive therapy, the first month was sent to her pharmacy.  Patient reassessed frequently at baseline on reassessment ambulatory feels comfortable discharge at this time.   Clinical Impression:  1. Secondary hypertension   2. Acute nonintractable headache, unspecified headache type      Discharge   Clinical Impression:  1. Secondary hypertension   2. Acute nonintractable headache, unspecified headache type      Discharge   Final Clinical Impression(s) / ED Diagnoses Final diagnoses:  Secondary hypertension  Acute nonintractable headache, unspecified headache type    Rx / DC Orders ED Discharge Orders          Ordered    amLODipine (NORVASC) 5 MG tablet  Daily        01/05/24 0258              Jerral Meth, MD 01/05/24 2256

## 2024-01-04 NOTE — ED Provider Notes (Incomplete)
 Christine Ingram Provider Note   CSN: [id number] Arrival date & time: 01/04/24  1639     History Chief Complaint  Patient presents with  . Hypertension  . Headache    HPI Christine Ingram is a 43 y.o. female presenting for chief complaint of blurry vision, headaches over the last 3 days.  Patient has a distant history of migraines but otherwise healthy 43 year old female.  States she had a distant history of hypertension but had been well-controlled with lifestyle modifications.  Denies fevers chills nausea vomiting syncope shortness of breath.  Headache is right-sided, persistent in nature over 3 days.  Onset relatively quickly she reports. Denies any other neurologic symptoms at this time. Blurry vision she describes as difficulty focusing her eyes.  Once her eyes focused she states that they can read letters clearly but that it is taking longer for her to use her phone or text things out over her baseline.   Patient's recorded medical, surgical, social, medication list and allergies were reviewed in the Snapshot window as part of the initial history.   Review of Systems   Review of Systems  Constitutional:  Negative for chills and fever.  HENT:  Negative for ear pain and sore throat.   Eyes:  Positive for visual disturbance. Negative for pain.  Respiratory:  Negative for cough and shortness of breath.   Cardiovascular:  Negative for chest pain and palpitations.  Gastrointestinal:  Negative for abdominal pain and vomiting.  Genitourinary:  Negative for dysuria and hematuria.  Musculoskeletal:  Negative for arthralgias and back pain.  Skin:  Negative for color change and rash.  Neurological:  Positive for headaches. Negative for seizures and syncope.  All other systems reviewed and are negative.   Physical Exam Updated Vital Signs BP (!) 182/114 (BP Location: Right Arm)   Pulse 70   Temp 98.7 F (37.1 C)   Resp 14   SpO2 99%   Physical Exam Vitals and nursing note reviewed.  Constitutional:      General: She is not in acute distress.    Appearance: She is well-developed.  HENT:     Head: Normocephalic and atraumatic.  Eyes:     Conjunctiva/sclera: Conjunctivae normal.  Cardiovascular:     Rate and Rhythm: Normal rate and regular rhythm.     Heart sounds: No murmur heard. Pulmonary:     Effort: Pulmonary effort is normal. No respiratory distress.     Breath sounds: Normal breath sounds.  Abdominal:     Palpations: Abdomen is soft.     Tenderness: There is no abdominal tenderness.  Musculoskeletal:        General: No swelling.     Cervical back: Neck supple.  Skin:    General: Skin is warm and dry.     Capillary Refill: Capillary refill takes less than 2 seconds.  Neurological:     Mental Status: She is alert.  Psychiatric:        Mood and Affect: Mood normal.      ED Course/ Medical Decision Making/ A&P    Procedures Procedures   Medications Ordered in ED Medications - No data to display  Medical Decision Making:   Christine Ingram is a 43 y.o. female who presented to the ED today with *** detailed above.    {crccomplexity:27900} Complete initial physical exam performed, notably the patient  was ***.    Reviewed and confirmed nursing documentation for past medical history, family history,  social history.    Initial Assessment:   With the patient's presentation of ***, most likely diagnosis is ***. Other diagnoses were considered including (but not limited to) ***. These are considered less likely due to history of present illness and physical exam findings.   {crccopa:27899}  Initial Plan:  ***  ***Screening labs including CBC and Metabolic panel to evaluate for infectious or metabolic etiology of disease.  ***Urinalysis with reflex culture ordered to evaluate for UTI or relevant urologic/nephrologic pathology.  ***CXR to evaluate for structural/infectious intrathoracic pathology.   {crccardiactesting:32591::EKG to evaluate for cardiac pathology} Objective evaluation as below reviewed   Initial Study Results:   Laboratory  All laboratory results reviewed without evidence of clinically relevant pathology.   ***Exceptions include: ***   ***EKG EKG was reviewed independently. Rate, rhythm, axis, intervals all examined and without medically relevant abnormality. ST segments without concerns for elevations.    Radiology:  All images reviewed independently. ***Agree with radiology report at this time.   CT Head Wo Contrast Result Date: 01/04/2024 EXAM: CT HEAD WITHOUT CONTRAST 01/04/2024 05:46:35 PM TECHNIQUE: CT of the head was performed without the administration of intravenous contrast. Automated exposure control, iterative reconstruction, and/or weight based adjustment of the mA/kV was utilized to reduce the radiation dose to as low as reasonably achievable. COMPARISON: None available. CLINICAL HISTORY: Headache and visual change. Patient complains of headache, dizziness, hypertension, slurred speech; Patient denies hx of stroke/seizures; Denies pregnancy. FINDINGS: BRAIN AND VENTRICLES: No acute hemorrhage. No evidence of acute infarct. No hydrocephalus. No extra-axial collection. No mass effect or midline shift. ORBITS: No acute abnormality. SINUSES: No acute abnormality. SOFT TISSUES AND SKULL: No acute soft tissue abnormality. No skull fracture. IMPRESSION: 1. No acute intracranial abnormality. Electronically signed by: Gilmore Molt MD 01/04/2024 06:00 PM EDT RP Workstation: [id number]      Consults: Case discussed with ***.   Reassessment and Plan:   ***    ***  Clinical Impression: No diagnosis found.   Data Unavailable   Final Clinical Impression(s) / ED Diagnoses Final diagnoses:  None    Rx / DC Orders ED Discharge Orders     None

## 2024-01-04 NOTE — ED Triage Notes (Signed)
 Pt here for HTN, vision problems and HA x 3 days.  192/121 when checked at home. 10/10 pain

## 2024-01-05 ENCOUNTER — Emergency Department (HOSPITAL_COMMUNITY): Payer: Self-pay

## 2024-01-05 MED ORDER — LACTATED RINGERS IV BOLUS
1000.0000 mL | Freq: Once | INTRAVENOUS | Status: AC
  Administered 2024-01-05: 1000 mL via INTRAVENOUS

## 2024-01-05 MED ORDER — IOHEXOL 350 MG/ML SOLN
75.0000 mL | Freq: Once | INTRAVENOUS | Status: AC | PRN
  Administered 2024-01-05: 75 mL via INTRAVENOUS

## 2024-01-05 MED ORDER — AMLODIPINE BESYLATE 5 MG PO TABS
5.0000 mg | ORAL_TABLET | Freq: Once | ORAL | Status: AC
  Administered 2024-01-05: 5 mg via ORAL
  Filled 2024-01-05: qty 1

## 2024-01-05 MED ORDER — DIPHENHYDRAMINE HCL 50 MG/ML IJ SOLN
25.0000 mg | Freq: Once | INTRAMUSCULAR | Status: AC
  Administered 2024-01-05: 25 mg via INTRAVENOUS
  Filled 2024-01-05: qty 1

## 2024-01-05 MED ORDER — AMLODIPINE BESYLATE 5 MG PO TABS: 5.0000 mg | ORAL_TABLET | Freq: Every day | ORAL | 0 refills | Status: AC

## 2024-01-05 MED ORDER — METOCLOPRAMIDE HCL 5 MG/ML IJ SOLN
10.0000 mg | Freq: Once | INTRAMUSCULAR | Status: AC
  Administered 2024-01-05: 10 mg via INTRAVENOUS
  Filled 2024-01-05: qty 2

## 2024-01-05 MED ORDER — HYDRALAZINE HCL 20 MG/ML IJ SOLN
5.0000 mg | Freq: Once | INTRAMUSCULAR | Status: AC
  Administered 2024-01-05: 5 mg via INTRAVENOUS
  Filled 2024-01-05: qty 1
